# Patient Record
Sex: Male | Born: 1985
Health system: Southern US, Community
[De-identification: ages and names within clinical notes are randomized; demographics above are authoritative.]

## PROBLEM LIST (undated history)

## (undated) DIAGNOSIS — E119 Type 2 diabetes mellitus without complications: Secondary | ICD-10-CM

## (undated) DIAGNOSIS — IMO0001 Reserved for inherently not codable concepts without codable children: Secondary | ICD-10-CM

## (undated) DIAGNOSIS — F32A Depression, unspecified: Secondary | ICD-10-CM

## (undated) DIAGNOSIS — F329 Major depressive disorder, single episode, unspecified: Secondary | ICD-10-CM

## (undated) HISTORY — DX: Type 2 diabetes mellitus without complications: E11.9

---

## 2011-12-29 ENCOUNTER — Encounter (HOSPITAL_COMMUNITY): Payer: Self-pay | Admitting: Emergency Medicine

## 2011-12-29 ENCOUNTER — Emergency Department (HOSPITAL_COMMUNITY)
Admission: EM | Admit: 2011-12-29 | Discharge: 2011-12-29 | Disposition: A | Discharge status: Home / Self Care | Payer: Self-pay | Attending: Emergency Medicine | Admitting: Emergency Medicine

## 2011-12-29 DIAGNOSIS — M1990 Unspecified osteoarthritis, unspecified site: Secondary | ICD-10-CM

## 2011-12-29 DIAGNOSIS — M129 Arthropathy, unspecified: Secondary | ICD-10-CM | POA: Insufficient documentation

## 2011-12-29 DIAGNOSIS — F17200 Nicotine dependence, unspecified, uncomplicated: Secondary | ICD-10-CM | POA: Insufficient documentation

## 2011-12-29 NOTE — Discharge Instructions (Signed)
Use heat on the sore areas 3 times a day for several days.  Take ibuprofen 400 mg 3 times a day for one week.  See the Dr. of your choice for problems.  Arthritis, Nonspecific Arthritis is inflammation of a joint. This usually means pain, redness, warmth or swelling are present. One or more joints may be involved. There are a number of types of arthritis. Your caregiver may not be able to tell what type of arthritis you have right away. CAUSES  The most common cause of arthritis is the wear and tear on the joint (osteoarthritis). This causes damage to the cartilage, which can break down over time. The knees, hips, back and neck are most often affected by this type of arthritis. Other types of arthritis and common causes of joint pain include:  Sprains and other injuries near the joint. Sometimes minor sprains and injuries cause pain and swelling that develop hours later.   Rheumatoid arthritis. This affects hands, feet and knees. It usually affects both sides of your body at the same time. It is often associated with chronic ailments, fever, weight loss and general weakness.   Crystal arthritis. Gout and pseudo gout can cause occasional acute severe pain, redness and swelling in the foot, ankle, or knee.   Infectious arthritis. Bacteria can get into a joint through a break in overlying skin. This can cause infection of the joint. Bacteria and viruses can also spread through the blood and affect your joints.   Drug, infectious and allergy reactions. Sometimes joints can become mildly painful and slightly swollen with these types of illnesses.  SYMPTOMS   Pain is the main symptom.   Your joint or joints can also be red, swollen and warm or hot to the touch.   You may have a fever with certain types of arthritis, or even feel overall ill.   The joint with arthritis will hurt with movement. Stiffness is present with some types of arthritis.  DIAGNOSIS  Your caregiver will suspect  arthritis based on your description of your symptoms and on your exam. Testing may be needed to find the type of arthritis:  Blood and sometimes urine tests.   X-ray tests and sometimes CT or MRI scans.   Removal of fluid from the joint (arthrocentesis) is done to check for bacteria, crystals or other causes. Your caregiver (or a specialist) will numb the area over the joint with a local anesthetic, and use a needle to remove joint fluid for examination. This procedure is only minimally uncomfortable.   Even with these tests, your caregiver may not be able to tell what kind of arthritis you have. Consultation with a specialist (rheumatologist) may be helpful.  TREATMENT  Your caregiver will discuss with you treatment specific to your type of arthritis. If the specific type cannot be determined, then the following general recommendations may apply. Treatment of severe joint pain includes:  Rest.   Elevation.   Anti-inflammatory medication (for example, ibuprofen) may be prescribed. Avoiding activities that cause increased pain.   Only take over-the-counter or prescription medicines for pain and discomfort as recommended by your caregiver.   Cold packs over an inflamed joint may be used for 10 to 15 minutes every hour. Hot packs sometimes feel better, but do not use overnight. Do not use hot packs if you are diabetic without your caregiver's permission.   A cortisone shot into arthritic joints may help reduce pain and swelling.   Any acute arthritis that gets worse over  the next 1 to 2 days needs to be looked at to be sure there is no joint infection.  Long-term arthritis treatment involves modifying activities and lifestyle to reduce joint stress jarring. This can include weight loss. Also, exercise is needed to nourish the joint cartilage and remove waste. This helps keep the muscles around the joint strong. HOME CARE INSTRUCTIONS   Do not take aspirin to relieve pain if gout is  suspected. This elevates uric acid levels.   Only take over-the-counter or prescription medicines for pain, discomfort or fever as directed by your caregiver.   Rest the joint as much as possible.   If your joint is swollen, keep it elevated.   Use crutches if the painful joint is in your leg.   Drinking plenty of fluids may help for certain types of arthritis.   Follow your caregiver's dietary instructions.   Try low-impact exercise such as:   Swimming.   Water aerobics.   Biking.   Walking.   Morning stiffness is often relieved by a warm shower.   Put your joints through regular range-of-motion.  SEEK MEDICAL CARE IF:   You do not feel better in 24 hours or are getting worse.   You have side effects to medications, or are not getting better with treatment.  SEEK IMMEDIATE MEDICAL CARE IF:   You have a fever.   You develop severe joint pain, swelling or redness.   Many joints are involved and become painful and swollen.   There is severe back pain and/or leg weakness.   You have loss of bowel or bladder control.  Document Released: 09/28/2004 Document Revised: 08/10/2011 Document Reviewed: 10/14/2008 [facility] Patient Information 2012 [place], [place].  RESOURCE GUIDE  Dental Problems  Patients with Medicaid: [facility] [phone] [address]                                           [phone] [address] Phone:  [phone]                                                  Phone:  [phone]  If unable to pay or uninsured, contact:  [facility] or [facility]. to become qualified for the adult dental clinic.  Chronic Pain Problems Contact [facility]  [phone] Patients need to be referred by their primary care doctor.  Insufficient Money for Medicine Contact [employer]:  call "211" or [facility] 229-106-7808.  No Primary Care Doctor Call [facility]   [phone] Other agencies that provide inexpensive medical care    [facility]  [phone]    [facility]  [phone]    [facility]  (807)428-7745    [facility]  [phone]    [facility]  281-376-6756    [facility]  8038593721  Psychological Services [facility]  [phone] [facility]  ([phone] [facility]   [phone] (emergency services 440-723-4512)  Substance Abuse Resources [facility]  904-709-4817 [facility] 203-684-9567 [facility] 717-299-9824 [facility] (804)532-0462 Residential & Outpatient Substance Abuse Program  905-299-9011  Abuse/Neglect [facility] Child Abuse Hotline (848)796-1469 [facility] Child Abuse Hotline 239-533-9007 (After Hours)  Emergency [facility]ter [facility] (980) 616-0175  [facility] [place] [facility] [place] (317) 099-2821 [facility] 346-332-9079  MRSA Hotline #:   3235006598    [facility] Resources  [facility] [place]     [employer]                          [facility] Dept. [address] [place]                       [address]      [address]  Blondell Reveal Phone:  [phone]                                   Phone:  [phone]                 Phone:  [phone]  [facility] Phone:  [phone]  [facility] Child Abuse Hotline 907-165-2128 [phone] (After Hours)

## 2011-12-29 NOTE — ED Notes (Signed)
Discharge instructions reviewed with pt; verbalizes understanding.  No questions asked; no further c/o's voiced.  Pt ambulatory to lobby.  NAD noted. 

## 2011-12-29 NOTE — ED Notes (Signed)
Patient states awake at 0300 went to sleep woke up at 1000 and had right upper extremity swelling radiating to right hand. Pain 1/10 achy dull needle feeling.  Radial pulses +2 bilateral equal. Bilateral equal sensation upper extremities,

## 2011-12-29 NOTE — ED Provider Notes (Signed)
History     CSN: [id number]  Arrival date & time 12/29/11  1354   None     Chief Complaint  Patient presents with  . Extremity Weakness    (Consider location/radiation/quality/duration/timing/severity/associated sxs/prior treatment) HPI Comments: Tyler Maxwell is a 26 y.o. Male who reports right hand pain and swelling since awakening today. His also does some cramps in his hands when he is at work for the last couple weeks; they're intermittent. No fever, chills, nausea, vomiting, neck pain or back pain. No trauma to the hands, arms, or neck. He has not tried nothing for the discomfort.  Patient is a 26 y.o. male presenting with extremity weakness. The history is provided by the patient.  Extremity Weakness    History reviewed. No pertinent past medical history.  History reviewed. No pertinent past surgical history.  No family history on file.  History  Substance Use Topics  . Smoking status: Current Some Day Smoker  . Smokeless tobacco: Not on file  . Alcohol Use: Yes      Review of Systems  Musculoskeletal: Positive for extremity weakness.  All other systems reviewed and are negative.    Allergies  Review of patient's allergies indicates no known allergies.  Home Medications  No current outpatient prescriptions on file.  BP 124/85  Temp(Src) 98.2 F (36.8 C) (Oral)  Resp 16  SpO2 98%  Physical Exam  Nursing note and vitals reviewed. Constitutional: He is oriented to person, place, and time. He appears well-developed and well-nourished.  HENT:  Head: Normocephalic and atraumatic.  Right Ear: External ear normal.  Left Ear: External ear normal.  Eyes: Conjunctivae and EOM are normal.  Neck: Normal range of motion and phonation normal. Neck supple.  Cardiovascular: Normal rate.   Pulmonary/Chest: Effort normal. He exhibits no bony tenderness.  Abdominal: Soft. Normal appearance.  Musculoskeletal: Normal range of motion.       Right hand has thick  calluses on the fingertips. No significant joint swelling. He has palpable tenderness of the dorsum of the wrist bones and his PIP joints 2 through 5. There are no deformities. There is no frank tendinitis. No erythema, no fluctuant areas.  Neurological: He is alert and oriented to person, place, and time. He has normal strength. No cranial nerve deficit or sensory deficit. He exhibits normal muscle tone. Coordination normal.  Skin: Skin is warm, dry and intact.  Psychiatric: He has a normal mood and affect. His behavior is normal. Judgment and thought content normal.    ED Course  Procedures (including critical care time)  Labs Reviewed - No data to display No results found.   1. Arthritis       MDM  Nonspecific wrist and hand pain, likely related to mild arthritis, which is likely use related from his job his age and prior. The tendinitis, septic arthritis, stress or traumatic fracture.  Plan: Home Medications- OTC Advil; Home Treatments- Heat; Recommended follow up-  PCP prn        Flint Melter, MD 12/30/11 1004

## 2012-01-03 ENCOUNTER — Emergency Department (HOSPITAL_COMMUNITY)
Admission: EM | Admit: 2012-01-03 | Discharge: 2012-01-03 | Discharge status: Absent without leave | Payer: Self-pay | Source: Home / Self Care

## 2012-01-03 ENCOUNTER — Encounter (HOSPITAL_COMMUNITY): Payer: Self-pay | Admitting: Emergency Medicine

## 2012-01-03 ENCOUNTER — Emergency Department (HOSPITAL_COMMUNITY)
Admission: EM | Admit: 2012-01-03 | Discharge: 2012-01-03 | Disposition: A | Discharge status: Home / Self Care | Payer: Self-pay | Attending: Emergency Medicine | Admitting: Emergency Medicine

## 2012-01-03 DIAGNOSIS — M25519 Pain in unspecified shoulder: Secondary | ICD-10-CM | POA: Insufficient documentation

## 2012-01-03 DIAGNOSIS — IMO0001 Reserved for inherently not codable concepts without codable children: Secondary | ICD-10-CM | POA: Insufficient documentation

## 2012-01-03 DIAGNOSIS — M79609 Pain in unspecified limb: Secondary | ICD-10-CM | POA: Insufficient documentation

## 2012-01-03 DIAGNOSIS — M7989 Other specified soft tissue disorders: Secondary | ICD-10-CM | POA: Insufficient documentation

## 2012-01-03 DIAGNOSIS — G540 Brachial plexus disorders: Secondary | ICD-10-CM | POA: Insufficient documentation

## 2012-01-03 HISTORY — DX: Reserved for inherently not codable concepts without codable children: IMO0001

## 2012-01-03 MED ORDER — PREDNISONE (PAK) 10 MG PO TABS: 10.0000 mg | ORAL_TABLET | Freq: Every day | ORAL | Status: AC

## 2012-01-03 MED ORDER — TRAMADOL HCL 50 MG PO TABS: 50.0000 mg | ORAL_TABLET | Freq: Four times a day (QID) | ORAL | Status: AC | PRN

## 2012-01-03 NOTE — Discharge Instructions (Signed)
Thoracic Outlet Syndrome  with Rehab Thoracic outlet syndrome is a condition in which nerves, and vessels (less common), are compressed or squeezed in the chest (thoracic) cavity. This results in pain and weakness in the shoulders, arms, and hands.  SYMPTOMS   Signs of nerve damage: pain, numbness, and tingling in the arm or hand.   Arm and hand weakness.   Signs of vascular damage: coldness, inflammation, blue or pale color in the hand and fingers (uncommon).  CAUSES  Thoracic outlet syndrome is caused by the squeezing of nerves, and possibly vessels, in the chest cavity, before they extend into the arm and hand. Just before leaving the chest cavity, the bundle of nerves and vessels (brachial plexus) passes by the collarbone (clavicle) and ribs. In this area, the bundle of nerves can come under increased pressure, which may result in thoracic outlet syndrome. Common sources of pressure include:  Rib cage.   Fracture of the first rib or clavicle.   Neck muscles that are too large.   Extending the arm above the head for a long period of time (during sleep or while unconscious)   Tumor (rare).  RISK INCREASES WITH:  Break (fracture) of the clavicle or first rib.   Neck muscles that become too large from bodybuilding.   Fast weight loss combined with vigorous physical exercise.  PREVENTION  Avoid activities where shoulder or neck injury are likely.   Wear properly fitted and padded protective equipment.   Maintain good posture.   Avoid carrying a bag or backpack that places pressure on the affected side.   Change sleeping positions. Try sleeping on one side, or sleep without a firm pillow.   Avoid building large neck muscles.  PROGNOSIS  If treated properly, symptoms of thoracic outlet syndrome normally go away with non-surgical treatment. Sometimes, surgery is necessary to free the bundle of nerves from pressure.  RELATED COMPLICATIONS   Permanent nerve damage, including  pain, numbness, tingling, or weakness.   Recurring symptoms that result in an ongoing problem.   Clotting (thrombosis) of the axillary vein in the shoulder. This is an emergency that needs to be treated immediately.   Risks of surgery: infection, bleeding, nerve damage, or damage to surrounding tissues.  TREATMENT  Treatment first involves resting from activities that aggravate the symptoms, and the use of ice and medicine to reduce pain and inflammation. The use of strengthening and stretching exercises may help reduce pain with activity. These exercises may be performed at home or with a therapist. It is important to improve posture and adjust sleeping habits to reduce the symptoms. If symptoms continue for more than 6 months, despite non-surgical treatment, surgery may be recommended. MEDICATION   If pain medicine is necessary, nonsteroidal anti-inflammatory medicines (aspirin and ibuprofen), or other minor pain relievers (acetaminophen), are often recommended.   Do not take pain medicine for 7 days before surgery.   Prescription pain relievers may be given if your caregiver thinks they are necessary. Use only as directed and only as much as you need.  SEEK MEDICAL CARE IF:   Treatment does not help, or the condition gets worse.   Any medicines produce negative side effects.   Any complications from surgery occur:   Pain, numbness, or coldness in the affected arm and hand.   Discoloration beneath the fingernails (blue or gray) of the affected hand.   Signs of infections: fever, pain, inflammation, redness, or persistent bleeding.  EXERCISES RANGE OF MOTION (ROM) AND STRETCHING EXERCISES -  Thoracic Outlet Syndrome These exercises may help you when beginning to recover from your injury. In order to successfully stop your symptoms, you must improve your posture. These exercises are designed to help reduce the forward-head and rounded-shoulder posture that contributes to this condition.  Your symptoms may go away with or without further involvement from your physician, physical therapist or athletic trainer. While completing these exercises, remember:   Restoring tissue flexibility helps return normal motion to the joints. This allows healthier, less painful movement and activity.   An effective stretch should be held for at least 30 seconds. Neck muscles are easily irritated, even if they do not seem to be bothered at the time of an exercise. It is often best to begin your stretches with hold times as short as 5 seconds and build up gradually.   A stretch should never be painful. You should only feel a gentle lengthening or release in the stretched tissue.  STRETECH - Axial Extension  Stand or sit on a firm surface. Assume a good posture: chest up, shoulders drawn back, abdominal (stomach) muscles slightly tense, knees unlocked (if standing), and feet hip width apart.   Slowly pull your chin back, so your head slides back and your chin slightly lowers. Continue to look straight ahead.   You should feel a gentle stretch in the back of your head. You should be certain not to feel a strong stretch, since this can cause headaches later.   Hold for __________ seconds.  Repeat __________ times. Complete this exercise __________ times per day. RANGE OF MOTION- Upper Thoracic Extension  Sit on a firm chair with a high back. Assume a good posture: chest up, shoulders drawn back, abdominal muscles slightly tense, and feet hip width apart. Place a small pillow or folded towel in the curve of your lower back, if you are having difficulty maintaining good posture.   Gently brace your neck with your hands, allowing your arms to rest on your chest.   Continue to support your neck and slowly extend your back over the chair. You will feel a stretch across your upper back.   Hold __________ seconds. Slowly return to the starting position.  Repeat __________ times. Complete this exercise  __________ times per day. STRETCH - Mid-thoracic Extension  Tape two tennis balls together, or secure them side-by-side in a sock.   Find a firm surface to lie on. Position the tennis balls so that one lies on each side of your spine, and they are both between your shoulder blades. You may bend your knees if you choose.   Remain in this position for 20 to 30 seconds. Gently move your body 1 to 2 inches up (in the direction of your head), so that the balls now roll on a slightly lower part of your back. Hold this position another 20-30 seconds. Continue to move the balls down your spine until you no longer feel a stretching sensation.  Repeat exercise __________ times, __________ times per day. STRENGTHENING EXERCISES - Thoracic Outlet Syndrome These exercises may help you when beginning to recover from your injury. In order to successfully stop your symptoms, you must improve your posture. These exercises are designed to help reduce the forward-head and rounded-shoulder posture that contributes to this condition. Your symptoms may go away with or without further involvement from your physician, physical therapist or athletic trainer. While completing these exercises, remember:   Muscles can gain both the endurance and the strength needed for everyday activities through  controlled exercises.   Complete these exercises as instructed by your physician, physical therapist or athletic trainer. Increase the resistance and repetitions only as guided.   You may experience muscle soreness or fatigue, but the pain or discomfort you are trying to eliminate should never worsen during these exercises. If this pain does get worse, stop and make sure you are following the directions exactly. If the pain is still present after adjustments, stop the exercise until you can discuss the trouble with your caretaker.  STRENGTH - Scapular Retractors  Secure a rubber exercise band or tubing around a fixed object (i.e.  table, pole), so that it is at the height of your shoulders when you are either standing, or sitting on a firm, armless chair.   With a palm-down grip, grasp an end of the band in each hand. Straighten your elbows out and lift your hands straight in front of you at shoulder height. Step back, away from the secured end of the band, until it becomes tense.   Squeezing your shoulder blades together, draw your elbows back as you bend them. Keep your upper arm lifted up, away from the sides of your body, throughout the exercise.   Hold __________ seconds. Slowly ease the tension on the band as you reverse the directions and return to the starting position.  Repeat __________ times. Complete this exercise __________ times per day. POSTURE - Thoracic Outlet Syndrome Keeping correct posture when sitting, standing or completing your activities will reduce the stress put on different body tissues. This will allow injured tissues a chance to heal and limit painful experiences. The following are general guidelines for improved posture. Your physician or physical therapist will provide you with any instructions specific to your needs. While reading these guidelines, remember:  The exercises prescribed by your caregiver will help you develop the flexibility and strength to maintain correct postures.   Correct posture provides the best environment for your joints to work. All your joints have less wear and tear when properly supported by a spine with good posture. This means you will experience a healthier, less painful body.   Correct posture must be practiced with all of your activities, especially prolonged sitting and standing. Correct posture is as important during repetitive, low-stress activities (i.e. typing) as it is when doing a single, heavy-load activity (i.e. lifting).  PROLONGED STANDING WHILE SLIGHTLY LEANING FORWARD When completing a task that requires you to lean forward, while standing in one  place for a long time, place one foot up on a stationary 2 - 4 inch high object, to help maintain the best posture. When both feet are on the ground, the low back tends to lose its slight inward curve. If this curve flattens (or becomes too large), then the back and your other joints will experience too much stress, tire more quickly, and can cause pain.  PROLONGED ACTIVITY IN A FLEXED POSITION When completing a task that requires you to bend forward at your waist or lean over a low surface, find a way to stabilize 3 out of 4 of your limbs. You can place a hand or elbow on your thigh or rest a knee on the surface you are reaching across. This will provide you more stability so that your muscles do not tire as quickly. By keeping your knees relaxed, or slightly bent, you will also reduce stress across your low back. WALKING Walk with an upright posture. Your ears, shoulders and hips should all line up. OFFICE WORK  When working at a desk, create an environment that supports good, upright posture. Without extra support, muscles tire, leading to too much strain on joints and other tissues.  CHAIR:   A chair should be able to slide under your desk when your back makes contact with the back of the chair. This allows you to work closely.   The chair's height should allow your eyes to be level with the upper part of your monitor and your hands to be slightly lower than your elbows.  BODY POSITION  Your feet should make contact with the floor. If this is not possible, use a footrest.   Keep your ears aligned over your shoulders. This will reduce stress on your neck and low back.  Document Released: 08/21/2005 Document Revised: 08/10/2011 Document Reviewed: 12/03/2008 [facility] Patient Information 2012 [place], [place].Thoracic Outlet Syndrome  with Rehab Thoracic outlet syndrome is a condition in which nerves, and vessels (less common), are compressed or squeezed in the chest (thoracic) cavity. This results  in pain and weakness in the shoulders, arms, and hands.  SYMPTOMS   Signs of nerve damage: pain, numbness, and tingling in the arm or hand.   Arm and hand weakness.   Signs of vascular damage: coldness, inflammation, blue or pale color in the hand and fingers (uncommon).  CAUSES  Thoracic outlet syndrome is caused by the squeezing of nerves, and possibly vessels, in the chest cavity, before they extend into the arm and hand. Just before leaving the chest cavity, the bundle of nerves and vessels (brachial plexus) passes by the collarbone (clavicle) and ribs. In this area, the bundle of nerves can come under increased pressure, which may result in thoracic outlet syndrome. Common sources of pressure include:  Rib cage.   Fracture of the first rib or clavicle.   Neck muscles that are too large.   Extending the arm above the head for a long period of time (during sleep or while unconscious)   Tumor (rare).  RISK INCREASES WITH:  Break (fracture) of the clavicle or first rib.   Neck muscles that become too large from bodybuilding.   Fast weight loss combined with vigorous physical exercise.  PREVENTION  Avoid activities where shoulder or neck injury are likely.   Wear properly fitted and padded protective equipment.   Maintain good posture.   Avoid carrying a bag or backpack that places pressure on the affected side.   Change sleeping positions. Try sleeping on one side, or sleep without a firm pillow.   Avoid building large neck muscles.  PROGNOSIS  If treated properly, symptoms of thoracic outlet syndrome normally go away with non-surgical treatment. Sometimes, surgery is necessary to free the bundle of nerves from pressure.  RELATED COMPLICATIONS   Permanent nerve damage, including pain, numbness, tingling, or weakness.   Recurring symptoms that result in an ongoing problem.   Clotting (thrombosis) of the axillary vein in the shoulder. This is an emergency that needs to  be treated immediately.   Risks of surgery: infection, bleeding, nerve damage, or damage to surrounding tissues.  TREATMENT  Treatment first involves resting from activities that aggravate the symptoms, and the use of ice and medicine to reduce pain and inflammation. The use of strengthening and stretching exercises may help reduce pain with activity. These exercises may be performed at home or with a therapist. It is important to improve posture and adjust sleeping habits to reduce the symptoms. If symptoms continue for more than 6 months, despite non-surgical treatment,  surgery may be recommended. MEDICATION   If pain medicine is necessary, nonsteroidal anti-inflammatory medicines (aspirin and ibuprofen), or other minor pain relievers (acetaminophen), are often recommended.   Do not take pain medicine for 7 days before surgery.   Prescription pain relievers may be given if your caregiver thinks they are necessary. Use only as directed and only as much as you need.  SEEK MEDICAL CARE IF:   Treatment does not help, or the condition gets worse.   Any medicines produce negative side effects.   Any complications from surgery occur:   Pain, numbness, or coldness in the affected arm and hand.   Discoloration beneath the fingernails (blue or gray) of the affected hand.   Signs of infections: fever, pain, inflammation, redness, or persistent bleeding.  EXERCISES RANGE OF MOTION (ROM) AND STRETCHING EXERCISES - Thoracic Outlet Syndrome These exercises may help you when beginning to recover from your injury. In order to successfully stop your symptoms, you must improve your posture. These exercises are designed to help reduce the forward-head and rounded-shoulder posture that contributes to this condition. Your symptoms may go away with or without further involvement from your physician, physical therapist or athletic trainer. While completing these exercises, remember:   Restoring tissue  flexibility helps return normal motion to the joints. This allows healthier, less painful movement and activity.   An effective stretch should be held for at least 30 seconds. Neck muscles are easily irritated, even if they do not seem to be bothered at the time of an exercise. It is often best to begin your stretches with hold times as short as 5 seconds and build up gradually.   A stretch should never be painful. You should only feel a gentle lengthening or release in the stretched tissue.  STRETECH - Axial Extension  Stand or sit on a firm surface. Assume a good posture: chest up, shoulders drawn back, abdominal (stomach) muscles slightly tense, knees unlocked (if standing), and feet hip width apart.   Slowly pull your chin back, so your head slides back and your chin slightly lowers. Continue to look straight ahead.   You should feel a gentle stretch in the back of your head. You should be certain not to feel a strong stretch, since this can cause headaches later.   Hold for __________ seconds.  Repeat __________ times. Complete this exercise __________ times per day. RANGE OF MOTION- Upper Thoracic Extension  Sit on a firm chair with a high back. Assume a good posture: chest up, shoulders drawn back, abdominal muscles slightly tense, and feet hip width apart. Place a small pillow or folded towel in the curve of your lower back, if you are having difficulty maintaining good posture.   Gently brace your neck with your hands, allowing your arms to rest on your chest.   Continue to support your neck and slowly extend your back over the chair. You will feel a stretch across your upper back.   Hold __________ seconds. Slowly return to the starting position.  Repeat __________ times. Complete this exercise __________ times per day. STRETCH - Mid-thoracic Extension  Tape two tennis balls together, or secure them side-by-side in a sock.   Find a firm surface to lie on. Position the tennis  balls so that one lies on each side of your spine, and they are both between your shoulder blades. You may bend your knees if you choose.   Remain in this position for 20 to 30 seconds. Gently move your  body 1 to 2 inches up (in the direction of your head), so that the balls now roll on a slightly lower part of your back. Hold this position another 20-30 seconds. Continue to move the balls down your spine until you no longer feel a stretching sensation.  Repeat exercise __________ times, __________ times per day. STRENGTHENING EXERCISES - Thoracic Outlet Syndrome These exercises may help you when beginning to recover from your injury. In order to successfully stop your symptoms, you must improve your posture. These exercises are designed to help reduce the forward-head and rounded-shoulder posture that contributes to this condition. Your symptoms may go away with or without further involvement from your physician, physical therapist or athletic trainer. While completing these exercises, remember:   Muscles can gain both the endurance and the strength needed for everyday activities through controlled exercises.   Complete these exercises as instructed by your physician, physical therapist or athletic trainer. Increase the resistance and repetitions only as guided.   You may experience muscle soreness or fatigue, but the pain or discomfort you are trying to eliminate should never worsen during these exercises. If this pain does get worse, stop and make sure you are following the directions exactly. If the pain is still present after adjustments, stop the exercise until you can discuss the trouble with your caretaker.  STRENGTH - Scapular Retractors  Secure a rubber exercise band or tubing around a fixed object (i.e. table, pole), so that it is at the height of your shoulders when you are either standing, or sitting on a firm, armless chair.   With a palm-down grip, grasp an end of the band in each  hand. Straighten your elbows out and lift your hands straight in front of you at shoulder height. Step back, away from the secured end of the band, until it becomes tense.   Squeezing your shoulder blades together, draw your elbows back as you bend them. Keep your upper arm lifted up, away from the sides of your body, throughout the exercise.   Hold __________ seconds. Slowly ease the tension on the band as you reverse the directions and return to the starting position.  Repeat __________ times. Complete this exercise __________ times per day. POSTURE - Thoracic Outlet Syndrome Keeping correct posture when sitting, standing or completing your activities will reduce the stress put on different body tissues. This will allow injured tissues a chance to heal and limit painful experiences. The following are general guidelines for improved posture. Your physician or physical therapist will provide you with any instructions specific to your needs. While reading these guidelines, remember:  The exercises prescribed by your caregiver will help you develop the flexibility and strength to maintain correct postures.   Correct posture provides the best environment for your joints to work. All your joints have less wear and tear when properly supported by a spine with good posture. This means you will experience a healthier, less painful body.   Correct posture must be practiced with all of your activities, especially prolonged sitting and standing. Correct posture is as important during repetitive, low-stress activities (i.e. typing) as it is when doing a single, heavy-load activity (i.e. lifting).  PROLONGED STANDING WHILE SLIGHTLY LEANING FORWARD When completing a task that requires you to lean forward, while standing in one place for a long time, place one foot up on a stationary 2 - 4 inch high object, to help maintain the best posture. When both feet are on the ground, the low back  tends to lose its slight  inward curve. If this curve flattens (or becomes too large), then the back and your other joints will experience too much stress, tire more quickly, and can cause pain.  PROLONGED ACTIVITY IN A FLEXED POSITION When completing a task that requires you to bend forward at your waist or lean over a low surface, find a way to stabilize 3 out of 4 of your limbs. You can place a hand or elbow on your thigh or rest a knee on the surface you are reaching across. This will provide you more stability so that your muscles do not tire as quickly. By keeping your knees relaxed, or slightly bent, you will also reduce stress across your low back. WALKING Walk with an upright posture. Your ears, shoulders and hips should all line up. OFFICE WORK When working at a desk, create an environment that supports good, upright posture. Without extra support, muscles tire, leading to too much strain on joints and other tissues.  CHAIR:   A chair should be able to slide under your desk when your back makes contact with the back of the chair. This allows you to work closely.   The chair's height should allow your eyes to be level with the upper part of your monitor and your hands to be slightly lower than your elbows.  BODY POSITION  Your feet should make contact with the floor. If this is not possible, use a footrest.   Keep your ears aligned over your shoulders. This will reduce stress on your neck and low back.  Document Released: 08/21/2005 Document Revised: 08/10/2011 Document Reviewed: 12/03/2008 [facility] Patient Information 2012 [place], [place].

## 2012-01-03 NOTE — ED Notes (Signed)
Patient complaining of right arm pain and swelling that started last week; patient was seen in the ED last week and diagnosed with tendonitis versus arthritis.  Patient states that he was given four days off of work and returned today, but his arm was too painful to work.  Patient able to wiggle all digits freely.  Patient has been taken Motrin at home for the pain.

## 2012-01-03 NOTE — ED Provider Notes (Signed)
Medical screening examination/treatment/procedure(s) were performed by non-physician practitioner and as supervising physician I was immediately available for consultation/collaboration.  Donnetta Hutching, MD 01/03/12 2348

## 2012-01-03 NOTE — ED Provider Notes (Signed)
History     CSN: [id number]  Arrival date & time 01/03/12  1939   First MD Initiated Contact with Patient 01/03/12 2131      Chief Complaint  Patient presents with  . Arm Pain    (Consider location/radiation/quality/duration/timing/severity/associated sxs/prior treatment) HPI  26 year old male presents c/o R arm pain.  Pt sts for the past 10 days he has been having gradual onset of R arm pain.  Pain initially started in R chest, and gradually radiates down to shoulder, upper and lower arm and eventually to hand.  Felt that his hands are weaker than usual.  Pain is throbbing, constant, worsening with arm rotation or with movement.  Does notice increase swelling to R arm as compare to left.  Also experiences tingling sensation down to arm. Denies fever, cp, sob, rash. Is R hand dominant and works at [employer] where he does admits to repetitive work. He was seen in ER 4 days ago for same. Was diagnosed with arthritis and was given Motrin and 4 days off work.  Return to work but unable to fully use R arm due to pain and weakness.  Sts Motrin helps minimally.  Denies recent trauma.   Past Medical History  Diagnosis Date  . No significant past medical history     History reviewed. No pertinent past surgical history.  History reviewed. No pertinent family history.  History  Substance Use Topics  . Smoking status: Current Some Day Smoker  . Smokeless tobacco: Not on file  . Alcohol Use: Yes      Review of Systems  Constitutional: Negative for fever.  Respiratory: Negative for chest tightness, shortness of breath and wheezing.   Cardiovascular: Negative for leg swelling.  Skin: Negative for rash.  Neurological: Positive for weakness.  All other systems reviewed and are negative.    Allergies  Review of patient's allergies indicates no known allergies.  Home Medications   Current Outpatient Rx  Name Route Sig Dispense Refill  . IBUPROFEN 200 MG PO TABS Oral Take 400 mg  by mouth every 6 (six) hours as needed. For pain      BP 134/80  Pulse 83  Temp(Src) 97.9 F (36.6 C) (Oral)  Resp 20  SpO2 98%  Physical Exam  Nursing note and vitals reviewed. Constitutional: He is oriented to person, place, and time. He appears well-developed and well-nourished. No distress.  HENT:  Head: Atraumatic.  Eyes: Conjunctivae are normal.  Neck: Neck supple.  Cardiovascular: Normal rate and regular rhythm.   Pulmonary/Chest: Effort normal. No respiratory distress. He has no wheezes. He exhibits no tenderness.  Neurological: He is alert and oriented to person, place, and time. He displays normal reflexes. No cranial nerve deficit. He exhibits normal muscle tone. Coordination normal.       R arm: tenderness with movement throughout.  Decreased grip strength.  2+ radial pulse, brisk cap refills, no overlying skin changes, sensation intact.  Negative tinel sign.  FROM, mild weakness noted as compare to L arm    Skin: Skin is warm.  Psychiatric: He has a normal mood and affect.    ED Course  Procedures (including critical care time)  Labs Reviewed - No data to display No results found.   No diagnosis found.    MDM  Persistent pain and weakness to R arm, originate from R chest. Unrelieved with rest and motrin.  Consider thoracic outlet syndrome due to tingling sensation.  Increase weakness to 4th,5th fingers.  Low  suspicion for carpal tunnel syndrome.  No suspicion for tenosynovitis.  Pt is afebrile.  Plan to give prednisone, hydrocodone, and f/u with ortho for further eval.  Pt voice understanding.  Doubt cardiopulmonary etiology.          Fayrene Helper, PA-C 01/03/12 2148

## 2012-08-27 ENCOUNTER — Emergency Department (HOSPITAL_COMMUNITY)
Admission: EM | Admit: 2012-08-27 | Discharge: 2012-08-27 | Discharge status: Against Medical Advice | Payer: Self-pay | Attending: Emergency Medicine | Admitting: Emergency Medicine

## 2012-08-27 ENCOUNTER — Encounter (HOSPITAL_COMMUNITY): Payer: Self-pay | Admitting: *Deleted

## 2012-08-27 DIAGNOSIS — F17200 Nicotine dependence, unspecified, uncomplicated: Secondary | ICD-10-CM | POA: Insufficient documentation

## 2012-08-27 DIAGNOSIS — F39 Unspecified mood [affective] disorder: Secondary | ICD-10-CM | POA: Insufficient documentation

## 2012-08-27 DIAGNOSIS — F1010 Alcohol abuse, uncomplicated: Secondary | ICD-10-CM | POA: Insufficient documentation

## 2012-08-27 DIAGNOSIS — F10929 Alcohol use, unspecified with intoxication, unspecified: Secondary | ICD-10-CM

## 2012-08-27 LAB — CBC WITH DIFFERENTIAL/PLATELET
Eosinophils Relative: 5 % (ref 0–5)
HCT: 45.2 % (ref 39.0–52.0)
Hemoglobin: 15.8 g/dL (ref 13.0–17.0)
Lymphocytes Relative: 46 % (ref 12–46)
Lymphs Abs: 2.5 10*3/uL (ref 0.7–4.0)
MCV: 90.2 fL (ref 78.0–100.0)
Monocytes Absolute: 0.4 10*3/uL (ref 0.1–1.0)
Monocytes Relative: 8 % (ref 3–12)
Neutro Abs: 2.1 10*3/uL (ref 1.7–7.7)
RBC: 5.01 MIL/uL (ref 4.22–5.81)
WBC: 5.4 10*3/uL (ref 4.0–10.5)

## 2012-08-27 LAB — RAPID URINE DRUG SCREEN, HOSP PERFORMED
Barbiturates: NOT DETECTED
Benzodiazepines: NOT DETECTED

## 2012-08-27 LAB — URINALYSIS, ROUTINE W REFLEX MICROSCOPIC
Ketones, ur: NEGATIVE mg/dL
Leukocytes, UA: NEGATIVE
Nitrite: NEGATIVE
Protein, ur: NEGATIVE mg/dL
Urobilinogen, UA: 0.2 mg/dL (ref 0.0–1.0)
pH: 6.5 (ref 5.0–8.0)

## 2012-08-27 LAB — COMPREHENSIVE METABOLIC PANEL
AST: 25 U/L (ref 0–37)
CO2: 25 mEq/L (ref 19–32)
Calcium: 9.9 mg/dL (ref 8.4–10.5)
Chloride: 103 mEq/L (ref 96–112)
Creatinine, Ser: 0.83 mg/dL (ref 0.50–1.35)
GFR calc Af Amer: >90 mL/min (ref >90)
GFR calc non Af Amer: >90 mL/min (ref >90)
Glucose, Bld: 86 mg/dL (ref 70–99)
Total Bilirubin: 0.3 mg/dL (ref 0.3–1.2)

## 2012-08-27 NOTE — ED Provider Notes (Addendum)
History     CSN: [id number]  Arrival date & time 08/27/12  0058   First MD Initiated Contact with Patient 08/27/12 0214      Chief Complaint  Patient presents with  . detox from alcohol     (Consider location/radiation/quality/duration/timing/severity/associated sxs/prior treatment) HPI Patient states he wants detox from alcohol. Last drink 40 minutes prior to coming here. Denies using other drugs. No other complaint no treatment prior to coming here  Past Medical History  Diagnosis Date  . No significant past medical history    past medicalhistory Alcohol abuse  No past surgical history on file. Surgical history hernia repair  No family history on file.  History  Substance Use Topics  . Smoking status: Current Some Day Smoker  . Smokeless tobacco: Not on file  . Alcohol Use: Yes     no drug use  Review of Systems  Constitutional: Negative.   HENT: Negative.   Respiratory: Negative.   Cardiovascular: Negative.   Gastrointestinal: Negative.   Musculoskeletal: Negative.   Skin: Negative.   Neurological: Negative.   Hematological: Negative.   Psychiatric/Behavioral: Positive for dysphoric mood.       Depressed, not suicidal or homicidal  All other systems reviewed and are negative.    Allergies  Review of patient's allergies indicates no known allergies.  Home Medications   Current Outpatient Rx  Name  Route  Sig  Dispense  Refill  . IBUPROFEN 200 MG PO TABS   Oral   Take 400 mg by mouth every 6 (six) hours as needed. For pain           BP 127/74  Pulse 83  Temp 98.1 F (36.7 C) (Oral)  Resp 16  SpO2 97%  Physical Exam  Nursing note and vitals reviewed. Constitutional: He is oriented to person, place, and time. He appears well-developed and well-nourished.       Appears intoxicated speech slurred  HENT:  Head: Normocephalic and atraumatic.  Eyes: Conjunctivae normal are normal. Pupils are equal, round, and reactive to light.  Neck: Neck  supple. No tracheal deviation present. No thyromegaly present.  Cardiovascular: Normal rate and regular rhythm.   No murmur heard. Pulmonary/Chest: Effort normal and breath sounds normal.  Abdominal: Soft. Bowel sounds are normal. He exhibits no distension. There is no tenderness.  Musculoskeletal: Normal range of motion. He exhibits no edema and no tenderness.  Neurological: He is alert and oriented to person, place, and time. Coordination normal.       Gait normal  Skin: Skin is warm and dry. No rash noted.  Psychiatric: He has a normal mood and affect.    ED Course  Procedures (including critical care time)  Labs Reviewed  CBC WITH DIFFERENTIAL - Abnormal; Notable for the following:    Neutrophils Relative 40 (*)     All other components within normal limits  ETHANOL - Abnormal; Notable for the following:    Alcohol, Ethyl (B) 285 (*)     All other components within normal limits  URINALYSIS, ROUTINE W REFLEX MICROSCOPIC  COMPREHENSIVE METABOLIC PANEL  URINE RAPID DRUG SCREEN (HOSP PERFORMED)   No results found.   No diagnosis found.  2:20 AM patient requests to go home Is presently too intoxicated to leave unsupervised  2:40 AM I was notified the patient left the emergency department without telling staff. Nursing staff reported that patient was able to get a ride home. He left without waiting for instructions MDM   Results for orders  placed during the hospital encounter of 08/27/12  URINALYSIS, ROUTINE W REFLEX MICROSCOPIC      Component Value Range   Color, Urine YELLOW  YELLOW   APPearance CLEAR  CLEAR   Specific Gravity, Urine 1.005  1.005 - 1.030   pH 6.5  5.0 - 8.0   Glucose, UA NEGATIVE  NEGATIVE mg/dL   Hgb urine dipstick NEGATIVE  NEGATIVE   Bilirubin Urine NEGATIVE  NEGATIVE   Ketones, ur NEGATIVE  NEGATIVE mg/dL   Protein, ur NEGATIVE  NEGATIVE mg/dL   Urobilinogen, UA 0.2  0.0 - 1.0 mg/dL   Nitrite NEGATIVE  NEGATIVE   Leukocytes, UA NEGATIVE   NEGATIVE  URINE RAPID DRUG SCREEN (HOSP PERFORMED)      Component Value Range   Opiates NONE DETECTED  NONE DETECTED   Cocaine NONE DETECTED  NONE DETECTED   Benzodiazepines NONE DETECTED  NONE DETECTED   Amphetamines NONE DETECTED  NONE DETECTED   Tetrahydrocannabinol NONE DETECTED  NONE DETECTED   Barbiturates NONE DETECTED  NONE DETECTED  CBC WITH DIFFERENTIAL      Component Value Range   WBC 5.4  4.0 - 10.5 K/uL   RBC 5.01  4.22 - 5.81 MIL/uL   Hemoglobin 15.8  13.0 - 17.0 g/dL   HCT 16.1  09.6 - 04.5 %   MCV 90.2  78.0 - 100.0 fL   MCH 31.5  26.0 - 34.0 pg   MCHC 35.0  30.0 - 36.0 g/dL   RDW 40.9  81.1 - 91.4 %   Platelets 280  150 - 400 K/uL   Neutrophils Relative 40 (*) 43 - 77 %   Neutro Abs 2.1  1.7 - 7.7 K/uL   Lymphocytes Relative 46  12 - 46 %   Lymphs Abs 2.5  0.7 - 4.0 K/uL   Monocytes Relative 8  3 - 12 %   Monocytes Absolute 0.4  0.1 - 1.0 K/uL   Eosinophils Relative 5  0 - 5 %   Eosinophils Absolute 0.3  0.0 - 0.7 K/uL   Basophils Relative 1  0 - 1 %   Basophils Absolute 0.1  0.0 - 0.1 K/uL  COMPREHENSIVE METABOLIC PANEL      Component Value Range   Sodium 142  135 - 145 mEq/L   Potassium 3.6  3.5 - 5.1 mEq/L   Chloride 103  96 - 112 mEq/L   CO2 25  19 - 32 mEq/L   Glucose, Bld 86  70 - 99 mg/dL   BUN 6  6 - 23 mg/dL   Creatinine, Ser 7.82  0.50 - 1.35 mg/dL   Calcium 9.9  8.4 - 95.6 mg/dL   Total Protein 8.3  6.0 - 8.3 g/dL   Albumin 4.7  3.5 - 5.2 g/dL   AST 25  0 - 37 U/L   ALT 13  0 - 53 U/L   Alkaline Phosphatase 78  39 - 117 U/L   Total Bilirubin 0.3  0.3 - 1.2 mg/dL   GFR calc non Af Amer >90  >90 mL/min   GFR calc Af Amer >90  >90 mL/min  ETHANOL      Component Value Range   Alcohol, Ethyl (B) 285 (*) 0 - 11 mg/dL   No results found.   In light of patient's getting a ride home, we'll not send police to retrieve him. He was mildly intoxicated on my exam Diagnosis alcohol intoxication        Doug Sou, MD 08/27/12  4540  Doug Sou, MD 08/27/12 [phone]

## 2012-08-27 NOTE — ED Notes (Signed)
The pt wants to be detoxed from alcohol.  His last drink was earlier tonight

## 2012-08-27 NOTE — ED Notes (Signed)
Pt not in room upon rounding. Nurse first states patient left with a ride. Pt did not inform this RN or any other staff member he was leaving.

## 2012-08-27 NOTE — ED Notes (Signed)
Pt reports drinking 1-2 40oz beers for the past two years.

## 2012-10-29 ENCOUNTER — Emergency Department (HOSPITAL_COMMUNITY)
Admission: EM | Admit: 2012-10-29 | Discharge: 2012-10-29 | Disposition: A | Discharge status: Other Acute Inpatient Hospital | Payer: Federal, State, Local not specified - Other | Attending: Emergency Medicine | Admitting: Emergency Medicine

## 2012-10-29 ENCOUNTER — Inpatient Hospital Stay (HOSPITAL_COMMUNITY)
Admission: AD | Admit: 2012-10-29 | Discharge: 2012-10-31 | DRG: 897 | Disposition: A | Discharge status: Home / Self Care | Payer: Federal, State, Local not specified - Other | Source: Intra-hospital | Attending: Psychiatry | Admitting: Psychiatry

## 2012-10-29 ENCOUNTER — Encounter (HOSPITAL_COMMUNITY): Payer: Self-pay | Admitting: *Deleted

## 2012-10-29 DIAGNOSIS — F1010 Alcohol abuse, uncomplicated: Principal | ICD-10-CM

## 2012-10-29 DIAGNOSIS — R45851 Suicidal ideations: Secondary | ICD-10-CM | POA: Insufficient documentation

## 2012-10-29 DIAGNOSIS — F17200 Nicotine dependence, unspecified, uncomplicated: Secondary | ICD-10-CM | POA: Insufficient documentation

## 2012-10-29 LAB — COMPREHENSIVE METABOLIC PANEL
AST: 64 U/L — ABNORMAL HIGH (ref 0–37)
Albumin: 4.6 g/dL (ref 3.5–5.2)
BUN: 5 mg/dL — ABNORMAL LOW (ref 6–23)
Chloride: 100 mEq/L (ref 96–112)
Creatinine, Ser: 0.77 mg/dL (ref 0.50–1.35)
Potassium: 3.7 mEq/L (ref 3.5–5.1)
Total Protein: 8.6 g/dL — ABNORMAL HIGH (ref 6.0–8.3)

## 2012-10-29 LAB — RAPID URINE DRUG SCREEN, HOSP PERFORMED
Amphetamines: NOT DETECTED
Benzodiazepines: NOT DETECTED
Cocaine: NOT DETECTED
Opiates: NOT DETECTED

## 2012-10-29 LAB — CBC
HCT: 42.8 % (ref 39.0–52.0)
MCHC: 36 g/dL (ref 30.0–36.0)
MCV: 90.7 fL (ref 78.0–100.0)
Platelets: 245 10*3/uL (ref 150–400)
RDW: 12.9 % (ref 11.5–15.5)
WBC: 4.7 10*3/uL (ref 4.0–10.5)

## 2012-10-29 LAB — SALICYLATE LEVEL: Salicylate Lvl: <2 mg/dL — ABNORMAL LOW (ref 2.8–20.0)

## 2012-10-29 LAB — ETHANOL: Alcohol, Ethyl (B): 322 mg/dL — ABNORMAL HIGH (ref 0–11)

## 2012-10-29 LAB — ACETAMINOPHEN LEVEL: Acetaminophen (Tylenol), Serum: <15 ug/mL (ref 10–30)

## 2012-10-29 MED ORDER — THIAMINE HCL 100 MG/ML IJ SOLN: 100.0000 mg | Freq: Once | INTRAMUSCULAR | Status: DC

## 2012-10-29 MED ORDER — LOPERAMIDE HCL 2 MG PO CAPS: 2.0000 mg | ORAL_CAPSULE | ORAL | Status: DC | PRN

## 2012-10-29 MED ORDER — CHLORDIAZEPOXIDE HCL 25 MG PO CAPS: 25.0000 mg | ORAL_CAPSULE | Freq: Four times a day (QID) | ORAL | Status: DC | PRN

## 2012-10-29 MED ORDER — VITAMIN B-1 100 MG PO TABS
100.0000 mg | ORAL_TABLET | Freq: Every day | ORAL | Status: DC
  Administered 2012-10-30 – 2012-10-31 (×2): 100 mg via ORAL
  Filled 2012-10-29 (×4): qty 1

## 2012-10-29 MED ORDER — MAGNESIUM HYDROXIDE 400 MG/5ML PO SUSP: 30.0000 mL | Freq: Every day | ORAL | Status: DC | PRN

## 2012-10-29 MED ORDER — HYDROXYZINE HCL 25 MG PO TABS
25.0000 mg | ORAL_TABLET | Freq: Four times a day (QID) | ORAL | Status: DC | PRN
  Administered 2012-10-31: 25 mg via ORAL

## 2012-10-29 MED ORDER — ONDANSETRON 4 MG PO TBDP: 4.0000 mg | ORAL_TABLET | Freq: Four times a day (QID) | ORAL | Status: DC | PRN

## 2012-10-29 MED ORDER — ALUM & MAG HYDROXIDE-SIMETH 200-200-20 MG/5ML PO SUSP
30.0000 mL | ORAL | Status: DC | PRN
  Administered 2012-10-30: 30 mL via ORAL

## 2012-10-29 MED ORDER — CHLORDIAZEPOXIDE HCL 25 MG PO CAPS
50.0000 mg | ORAL_CAPSULE | Freq: Once | ORAL | Status: AC
  Administered 2012-10-29: 50 mg via ORAL
  Filled 2012-10-29: qty 2

## 2012-10-29 MED ORDER — ADULT MULTIVITAMIN W/MINERALS CH
1.0000 | ORAL_TABLET | Freq: Every day | ORAL | Status: DC
  Administered 2012-10-30 – 2012-10-31 (×2): 1 via ORAL
  Filled 2012-10-29 (×4): qty 1

## 2012-10-29 MED ORDER — ACETAMINOPHEN 325 MG PO TABS: 650.0000 mg | ORAL_TABLET | Freq: Four times a day (QID) | ORAL | Status: DC | PRN

## 2012-10-29 NOTE — ED Notes (Signed)
Pt up to nurses station to get something to drink.

## 2012-10-29 NOTE — ED Notes (Signed)
PT FATHER HAS TAKEN HIS BELONGINGS HOME.

## 2012-10-29 NOTE — ED Notes (Signed)
Patient is sleeping. He does stir about and open eyes with verbal and tactile stimulus. Patient is to be observed while sobering up then will further evaluate pt si and request for detox assistance. Sitter is at bedside for safety.

## 2012-10-29 NOTE — ED Provider Notes (Signed)
History     CSN: [id number]  Arrival date & time 10/29/12  0209   First MD Initiated Contact with Patient 10/29/12 0250      Chief Complaint  Patient presents with  . Alcohol Intoxication  . Suicidal     Patient is a 27 y.o. male presenting with intoxication. The history is provided by the patient.  Alcohol Intoxication This is a new problem. Episode onset: unknown time ago. The problem occurs constantly. The problem has been gradually worsening. Nothing aggravates the symptoms. Nothing relieves the symptoms.  pt presents for alcohol intoxication.  He told the nurse he wants to harm himself  Past Medical History  Diagnosis Date  . No significant past medical history     History reviewed. No pertinent past surgical history.  History reviewed. No pertinent family history.  History  Substance Use Topics  . Smoking status: Current Some Day Smoker  . Smokeless tobacco: Not on file  . Alcohol Use: Yes     Comment: 3-4 40's a day      Review of Systems  Unable to perform ROS: Mental status change    Allergies  Review of patient's allergies indicates no known allergies.  Home Medications  No current outpatient prescriptions on file.  BP 116/67  Pulse 72  Temp(Src) 97.7 F (36.5 C) (Oral)  Resp 16  SpO2 98%  Physical Exam CONSTITUTIONAL: somnolent but arousable HEAD: Normocephalic/atraumatic, no signs of trauma EYES: EOMI/PERRL ENMT: Mucous membranes moist NECK: supple no meningeal signs, no bruising noted to his neck SPINE:entire spine nontender CV: S1/S2 noted, no murmurs/rubs/gallops noted LUNGS: Lungs are clear to auscultation bilaterally, no apparent distress ABDOMEN: soft, nontender, no rebound or guarding GU:no cva tenderness NEURO: Pt is somnolent but arousable, moves all extremities x4 EXTREMITIES: pulses normal, full ROM, no signs of trauma SKIN: warm, color normal PSYCH: somnolent  ED Course  Procedures (including critical care time)  Labs  Reviewed  COMPREHENSIVE METABOLIC PANEL - Abnormal; Notable for the following:    Glucose, Bld 107 (*)    BUN 5 (*)    Total Protein 8.6 (*)    AST 64 (*)    All other components within normal limits  ETHANOL - Abnormal; Notable for the following:    Alcohol, Ethyl (B) 322 (*)    All other components within normal limits  SALICYLATE LEVEL - Abnormal; Notable for the following:    Salicylate Lvl <2.0 (*)    All other components within normal limits  ACETAMINOPHEN LEVEL  CBC  URINE RAPID DRUG SCREEN (HOSP PERFORMED)    Pt arrived with etoh intox and suicidal.  By the time of my eval he was sleeping but he is arousable.  He is heavily intoxicated.  I have spoken to ACT who will see patient once sober.   Labs reassuring thus far He reported he tried to hang himself last week but I don't see any signs of trauma to his neck   MDM  Nursing notes including past medical history and social history reviewed and considered in documentation Labs/vital reviewed and considered         Joya Gaskins, MD 10/29/12 [phone]

## 2012-10-29 NOTE — ED Notes (Signed)
Security here to escort Pt. To [facility]. Paperwork and belongings given to security. Security and sitter escorted Pt. Off unit .

## 2012-10-29 NOTE — ED Notes (Addendum)
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 Seeking help for detox from ETOH. Pt currently intoxicated, unsure of how much he has had to drink. AO x4. Clear speech. Calm and cooperative. Pt reports that he typically drinks 4-5 40's/ day along with an 8 pack of beer. Pt states "I have a problem, and I need to get help."

## 2012-10-29 NOTE — Tx Team (Signed)
Initial Interdisciplinary Treatment Plan  PATIENT STRENGTHS: (choose at least two) Ability for insight Active sense of humor Average or above average intelligence Capable of independent living Motivation for treatment/growth Physical Health Supportive family/friends  PATIENT STRESSORS: Substance abuse   PROBLEM LIST: Problem List/Patient Goals Date to be addressed Date deferred Reason deferred Estimated date of resolution  Alcohol Detox 10/29/12                                                      DISCHARGE CRITERIA:  Adequate post-discharge living arrangements Improved stabilization in mood, thinking, and/or behavior Medical problems require only outpatient monitoring Motivation to continue treatment in a less acute level of care Verbal commitment to aftercare and medication compliance Withdrawal symptoms are absent or subacute and managed without 24-hour nursing intervention  PRELIMINARY DISCHARGE PLAN: Attend aftercare/continuing care group Attend PHP/IOP Attend 12-step recovery group Outpatient therapy Return to previous living arrangement  PATIENT/FAMIILY INVOLVEMENT: This treatment plan has been presented to and reviewed with the patient, Tyler Maxwell, and/or family member,.  The patient and family have been given the opportunity to ask questions and make suggestions.  Tyler Maxwell [name] 10/29/2012, 11:15 PM

## 2012-10-29 NOTE — BH Assessment (Signed)
Assessment Note   Tyler Maxwell is an 27 y.o. male that presented intoxicated to ED and writer was unable to assess until pt sobered up.  This was second attempt to assess pt.  Pt now able to participate in assessment.  Pt reported he wants detox from alcohol.  Pt reports he drinks anywhere from 2-6 40 oz beers plus 8 regular beers daily.  Pt stated he last drank 6 40's plus more beer (pt unaware of amount).  Pt denies any other drug use.  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  Pt has no previous MH or SA hx.  Pt did say he tried to come to ED last month for detox, but left the ED.  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  Pt admits to passive SI, stating he has thoughts of not wanting to be here, and tried to hang himself from a tree last week by report.  He stated he couldn't kill himself because of his belief in God.  Pt denies HI.  Pt does admit to visual hallucinations, reporting he sees shadows, as well as auditory hallucinations.  He stated he hears his fiance calling his name.  Pt reports current withdrawal sx include shakiness and cramps.  Pt is not on any medications.  Pt is motivated for treatment and is pleasant and cooperative.  Completed assessment, assessment notification, and faxed to [facility] to run for possible admission.  Updated ED staff.  Axis I: 303.90 Alcohol Dependence, 296.34 Major Depressive Disorder, Recurrent, Severe With Psychotic Features Axis II: Deferred Axis III:  Past Medical History  Diagnosis Date  . No significant past medical history    Axis IV: economic problems, occupational problems, other psychosocial or environmental problems, problems with access to health care services and problems with primary support group Axis V: 31-40 impairment in reality  testing  Past Medical History:  Past Medical History  Diagnosis Date  . No significant past medical history     History reviewed. No pertinent past surgical history.  Family History: History reviewed. No pertinent family history.  Social History:  reports that he has been smoking.  He does not have any smokeless tobacco history on file. He reports that  drinks alcohol. He reports that he uses illicit drugs (Cocaine).  Additional Social History:  Alcohol / Drug Use Pain Medications: None Prescriptions: None Over the Counter: None History of alcohol / drug use?: Yes Withdrawal Symptoms: Tremors;Patient aware of relationship between substance abuse and physical/medical complications Substance #1 Name of Substance 1: ETOH 1 - Age of First Use: 13 1 - Amount (size/oz): 2-6 40 oz beers 1 - Frequency: 2-6 40 oz beers + 8 regular beers 1 - Duration: ongoing for years 1 - Last Use / Amount: 10/28/12 - 6 + 40 oz beers (pt cannot recall exact amount)  CIWA: CIWA-Ar BP: 118/73 mmHg Pulse Rate: 72 Nausea and Vomiting: no nausea and no vomiting Tactile Disturbances: none Tremor: no tremor Auditory Disturbances: not present Paroxysmal Sweats: no sweat visible Visual Disturbances: not present Anxiety: no anxiety, at ease Headache, Fullness in Head: none present Agitation: normal activity Orientation and Clouding of Sensorium: oriented and can do serial additions CIWA-Ar Total: 0 COWS:    Allergies: No Known Allergies  Home  Medications:  (Not in a hospital admission)  OB/GYN Status:  No LMP for male patient.  General Assessment Data Location of Assessment: [facility] ED Living Arrangements: Spouse/significant other (with fiance and 2 kids) Can pt return to current living arrangement?: Yes Admission Status: Voluntary Is patient capable of signing voluntary admission?: Yes Transfer from: Acute Hospital Referral Source: Self/Family/Friend  Education Status Is patient currently in  school?: No  Risk to self Suicidal Ideation: Yes-Currently Present Suicidal Intent: No Is patient at risk for suicide?: No Suicidal Plan?: No Access to Means: No What has been your use of drugs/alcohol within the last 12 months?: Dsily use of alcohol Previous Attempts/Gestures: Yes How many times?: 1 (Reported tried to hang self from tree last week) Other Self Harm Risks: pt denies Triggers for Past Attempts: Other (Comment) (fiance sheated on him, unemployed) Intentional Self Injurious Behavior: Damaging Comment - Self Injurious Behavior: Ongoing SA Family Suicide History: No Recent stressful life event(s): Conflict (Comment);Job Loss;Recent negative physical changes (Ongoing SA, fiance cheated on him, unemployed) Persecutory voices/beliefs?: No Depression: Yes Depression Symptoms: Despondent;Insomnia;Tearfulness;Guilt;Loss of interest in usual pleasures;Feeling worthless/self pity Substance abuse history and/or treatment for substance abuse?: No Suicide prevention information given to non-admitted patients: Yes  Risk to Others Homicidal Ideation: No Thoughts of Harm to Others: No Current Homicidal Intent: No Current Homicidal Plan: No Access to Homicidal Means: No Identified Victim: pt denies History of harm to others?: No Assessment of Violence: None Noted Violent Behavior Description: na - pt calm, cooperative Does patient have access to weapons?: No Criminal Charges Pending?: No Does patient have a court date: No  Psychosis Hallucinations: Visual (sees shadows) Delusions: None noted  Mental Status Report Appear/Hygiene: Disheveled Eye Contact: Good Motor Activity: Unremarkable Speech: Logical/coherent Level of Consciousness: Quiet/awake Mood: Depressed Affect: Appropriate to circumstance Anxiety Level: Minimal Thought Processes: Coherent;Relevant Judgement: Impaired Orientation: Person;Place;Time;Situation Obsessive Compulsive Thoughts/Behaviors:  None  Cognitive Functioning Concentration: Decreased Memory: Recent Intact;Remote Intact IQ: Average Insight: Fair Impulse Control: Poor Appetite: Poor Weight Loss: 0 Weight Gain: 0 Sleep: Decreased Total Hours of Sleep:  (Reports wakes every 20 mins) Vegetative Symptoms: None  ADLScreening [facility] Assessment Services) Patient's cognitive ability adequate to safely complete daily activities?: Yes Patient able to express need for assistance with ADLs?: Yes Independently performs ADLs?: Yes (appropriate for developmental age)  Abuse/Neglect [facility]) Physical Abuse: Denies Verbal Abuse: Denies Sexual Abuse: Denies  Prior Inpatient Therapy Prior Inpatient Therapy: No Prior Therapy Dates: na Prior Therapy Facilty/Provider(s): na Reason for Treatment: na  Prior Outpatient Therapy Prior Outpatient Therapy: No Prior Therapy Dates: na Prior Therapy Facilty/Provider(s): na Reason for Treatment: na  ADL Screening (condition at time of admission) Patient's cognitive ability adequate to safely complete daily activities?: Yes Patient able to express need for assistance with ADLs?: Yes Independently performs ADLs?: Yes (appropriate for developmental age)  Home Assistive Devices/Equipment Home Assistive Devices/Equipment: None    Abuse/Neglect Assessment (Assessment to be complete while patient is alone) Physical Abuse: Denies Verbal Abuse: Denies Sexual Abuse: Denies Exploitation of patient/patient's resources: Denies Self-Neglect: Denies Values / Beliefs Cultural Requests During Hospitalization: None Spiritual Requests During Hospitalization: None Consults Spiritual Care Consult Needed: No Social Work Consult Needed: No Merchant navy officer (For Healthcare) Advance Directive: Patient does not have advance directive;Patient would not like information    Additional Information 1:1 In Past 12 Months?: No CIRT Risk: No Elopement Risk: No Does patient have medical clearance?:  Yes     Disposition:  Disposition Initial Assessment Completed: Yes Disposition of Patient: Referred  to;Inpatient treatment program Type of inpatient treatment program: Adult Patient referred to: Other (Comment) (Pending [facility])  On Site Evaluation by:   Reviewed with Physician:  Delorse Limber, Rennis Harding 10/29/2012 3:18 PM

## 2012-10-29 NOTE — ED Notes (Signed)
Act team in to try to eval pt. He is still too intoxicated to participate

## 2012-10-29 NOTE — ED Notes (Signed)
When pt was asked if he had any thoughts of harming himself, he stated "I'll be straight with you, I tried to hang myself on the tree last week".  AC and CN notified, pt undressed and in blue scrubs, security called to wand pt.

## 2012-10-29 NOTE — ED Notes (Signed)
Pt states "I have a problem, a real alcohol problem.  I just need help".  States he has been drinking alcohol tonight, no drug use.

## 2012-10-29 NOTE — ED Notes (Addendum)
Patient admits that he drinks 4-5 40 ounce beers daily. States he feels suicidal when he drinks. States he tried to hang himself last weekend. Denies si or hi at this time. He is calm and cooperative with staff. Denies recreational drug use. States he has a place to live and that he has a support system of family and a fiance.

## 2012-10-29 NOTE — Progress Notes (Signed)
This is a 27 years old African American male admitted to the unit for alcohol detox. Patient reported that he has been drinking since he was 27 years old and he drinks 3-4 (40 oz) beer daily. He reported history of incarceration in 2009  for two years which was his longest period of sobriety. Patient endorsed feeling tired of drinking and wants to get help. He denied any history of Alcohol induced seizures. Reported occasional auditory hallucinations sometimes when he is drunk. He denied SI/HI, denied visual hallucinations, but endorsed some withdrawal symptoms, sweat, and stomach upset. Patient oriented to the unit, offered snacks and Q 15 minute check initiated to maintain safety. Skin assessment within normal limit, thought process organized and patient appeared calm and cooperative during this assessment.

## 2012-10-29 NOTE — ED Notes (Signed)
Report called to patrice at [facility]h 

## 2012-10-29 NOTE — ED Provider Notes (Signed)
Pt has been accepted at behavioral health by Dr. Dub Mikes, ACT team.    Ethelda Chick, MD 10/29/12 [phone]

## 2012-10-29 NOTE — BH Assessment (Signed)
Assessment Note  Update:  Received call from [facility] from Thurman Coyer, RN, [facility], stating pt accepted by PA Shelda Jakes to Dr. Dub Mikes to bed 306-1 and that pt could be transported to [facility].  Updated EDP Linker, completed support paperwork, and faxed to [facility] to log.  Updated ED staff.  Pt to be transported to [facility] via security, as pt is voluntary.    Disposition:  Disposition Initial Assessment Completed: Yes Disposition of Patient: Inpatient treatment program Type of inpatient treatment program: Adult Patient referred to: Other (Comment) (Pt accepted [facility])  On Site Evaluation by:   Reviewed with Physician:  Lora Paula 10/29/2012 5:14 PM

## 2012-10-30 ENCOUNTER — Encounter (HOSPITAL_COMMUNITY): Payer: Self-pay | Admitting: Psychiatry

## 2012-10-30 NOTE — Progress Notes (Signed)
Patient ID: Tyler Maxwell, male   DOB: 08-14-1986, 27 y.o.   MRN: [id number] He has been up and to groups interacting with peers and staff. Self inventory: depression 8, hopelessness 5, denies SI thoughts, c/o withdrawal symptoms; blurred vision,agitation. He denies thoughts of harming self spoke of wantnig to leave by Friday so that he could help care for his kids.  Has requested and received medication for gas today.
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
## 2012-10-30 NOTE — Tx Team (Signed)
Interdisciplinary Treatment Plan Update (Adult)  Date: 10/30/2012  Time Reviewed: 9:36 AM   Progress in Treatment: Attending groups: Yes Participating in groups: Yes Taking medication as prescribed:  NA  Tolerating medication:  NA Family/Significant othe contact made: Not as yet Patient understands diagnosis: Yes Discussing patient identified problems/goals with staff: Yes Medical problems stabilized or resolved:  Yes Denies suicidal/homicidal ideation: Yes Patient has not harmed self or Others: Yes  New problem(s) identified: None Identified  Discharge Plan or Barriers:  CSW is assessing for appropriate referrals.   Additional comments: N/A  Reason for Continuation of Hospitalization: Withdrawal symptoms   Estimated length of stay: 5-7 days  For review of initial/current patient goals, please see plan of care.  Attendees: Patient:     Other:  Antonieta Loveless Care Coordinator 10/30/2012 9:36 AM  Physician:  Geoffery Lyons 10/30/2012 9:36 AM   Nursing:   Robbie Louis, RN 10/30/2012 9:36 AM   Clinical Social Worker Ronda Fairly 10/30/2012 9:36 AM   Other:  Roswell Miners, RN 10/30/2012 9:36 AM   Other:  Malachi Pro, PA 10/30/2012 9:36 AM   Other:  Jari Favre, [place] PA 10/30/2012 9:36 AM   Other:  Olivia Mackie, Psych Intern 10/30/2012 9:36 AM    Scribe for Treatment Team:   Carney Bern, LCSWA  10/30/2012 9:36 AM

## 2012-10-30 NOTE — Progress Notes (Signed)
[facility] Inpatient LCSW Aftercare Discharge Planning Group Note  10/30/2012 8:35 AM  Participation Quality:  Appropriate  Affect:  Appropriate  Cognitive:  Appropriate  Insight:  Limited  Engagement in Group:  Limited  Modes of Intervention:  Clarification, Exploration, Orientation, Socialization and Support  Summary of Progress/Problems: Pt denies suicidal and homicidal ideation.  On a scale of 1 to 10 with ten being the most ever experienced, the patient rates depression at a 2 and anxiety at a 3. Tyler Maxwell shared that he recently was accepted to welding school but due to not meeting deadlines he has to wait until August to attend verses the classes he wanted to start in February.  He dealt with the disappointment by using alcohol    Clide Dales 10/30/2012, 8:35 AM

## 2012-10-30 NOTE — Progress Notes (Addendum)
[facility] LCSW Group Therapy  10/30/2012 1:15 PM  Type of Therapy:  Group Therapy 1:15 to 2:30  Participation Level:  Active  Participation Quality:  Appropriate  Affect:  Appropriate  Cognitive:  Alert and Oriented  Insight:  Developing  Engagement in Therapy:  Improving  Modes of Intervention:  Activity, Discussion, Exploration, Orientation and Support  Summary of Progress/Problems: Group members participated in activity in which patients choose photographs to represent what their life would look and feel like were it in balance and another for out of balance. Group members were able to process how daily decisions can affect the direction we are headed towards.  Mustapha shared that he believes he is ready to go and can leave drinking behind him although he later shared he has been through detox before.  Patient encouraged to consider the possibility that he does not have to do it alone.   Clide Dales
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
## 2012-10-30 NOTE — [facility] Suicide Risk Assessment (Signed)
Suicide Risk Assessment  Admission Assessment     Nursing information obtained from:    Demographic factors:    Current Mental Status:    Loss Factors:    Historical Factors:    Risk Reduction Factors:     CLINICAL FACTORS:   Alcohol/Substance Abuse/Dependencies  COGNITIVE FEATURES THAT CONTRIBUTE TO RISK: None identified   SUICIDE RISK:   Mild:  Suicidal ideation of limited frequency, intensity, duration, and specificity.  There are no identifiable plans, no associated intent, mild dysphoria and related symptoms, good self-control (both objective and subjective assessment), few other risk factors, and identifiable protective factors, including available and accessible social support.  PLAN OF CARE: Supportive approach/coping skills/relapse prevention                               Assess for withdrawal needing detox                               Reassess co morbidities  I certify that inpatient services furnished can reasonably be expected to improve the patient's condition.  Brea Coleson A 10/30/2012, 12:39 PM

## 2012-10-31 NOTE — Progress Notes (Signed)
Pt reports he is doing fine and wants to go home.  He denies SI/HI/AV.  He reports no withdrawal symptoms.  He said he went to some groups.  He has been pleasant/cooperative.  He is encouraged to make his needs known to staff.  Support/encouragement given.  Safety maintained with q15 minute checks.

## 2012-10-31 NOTE — Progress Notes (Signed)
[facility] Adult Case Management Discharge Plan :  Will you be returning to the same living situation after discharge: Yes,  with significant other and two children At discharge, do you have transportation home?:Yes,  vehicle at [facility], requesting [facility] security transport at discharge to [facility] Do you have the ability to pay for your medications: NA  Release of information consent forms completed and in the chart;  Patient's signature needed at discharge.  Patient to Follow up at: Follow-up Information   Follow up with [facility]: [facility] On 10/31/2012. (Call your therapist at [facility] day of discharge)    Contact information:   [address] [place], [place] [address] Ph [phone] Fax [phone]      Patient denies SI/HI:   Yes,  denies both    Safety Planning and Suicide Prevention discussed:  Yes,  in discharge planning group  Clide Dales 10/31/2012, 12:06 PM

## 2012-10-31 NOTE — [facility] Suicide Risk Assessment (Signed)
Suicide Risk Assessment  Discharge Assessment     Demographic Factors:  Adolescent or young adult  Mental Status Per Nursing Assessment::   On Admission:     Current Mental Status by Physician: In full contact with reality. There are no suicidal ideas plans or intent. His mood is euthymic, his affect is appropriate. He is committed to abstinece. He is going to go back home and work on getting a job He is going to go back to school to do get trained in [employer]. He is going to go to an outpatient program.  Loss Factors: Financial problems/change in socioeconomic status  Historical Factors: NA  Risk Reduction Factors:   Responsible for children under 72 years of age, Sense of responsibility to family and Living with another person, especially a relative  Continued Clinical Symptoms:  Alcohol/Substance Abuse/Dependencies  Cognitive Features That Contribute To Risk: No evidence   Suicide Risk:  Minimal: No identifiable suicidal ideation.  Patients presenting with no risk factors but with morbid ruminations; may be classified as minimal risk based on the severity of the depressive symptoms  Discharge Diagnoses:    AXIS I:  Alcohol Abuse, Substance Induced Mood disorder AXIS II:  Deferred AXIS III:   Past Medical History  Diagnosis Date  . No significant past medical history    AXIS IV:  occupational problems AXIS V:  61-70 mild symptoms  Plan Of Care/Follow-up recommendations:  Activity:  As tolerated Diet:  regular Follow up outpatient basis Is patient on multiple antipsychotic therapies at discharge:  No   Has Patient had three or more failed trials of antipsychotic monotherapy by history:  No  Recommended Plan for Multiple Antipsychotic Therapies: N/A   Tyler Maxwell A 10/31/2012, 12:37 PM

## 2012-10-31 NOTE — Progress Notes (Signed)
[facility] LCSW Aftercare Discharge Planning Group Note  10/31/2012 11:51 AM  Participation Quality:  Appropriate  Affect:  Appropriate  Cognitive:  Appropriate  Insight:  Improving  Engagement in Group:  Improving  Modes of Intervention:  Clarification, Exploration, Socialization and Support  Summary of Progress/Problems:n Pt denies suicidal and homicidal ideation.  On a scale of 1 to 10 with ten being the most ever experienced, the patient rates depression at a 0 and anxiety at a 0.  Tyler Maxwell requested a ride to [employer] at Discharge as his vehicle is there. Patient is aware of need to contact his case worker at [facility] upon discharge.      Tyler Maxwell 10/31/2012, 11:51 AM

## 2012-10-31 NOTE — Progress Notes (Signed)
Adult Psychoeducational Group Note  Date:  10/31/2012 Time:  7:28 PM  Group Topic/Focus:  Overcoming Stress:   The focus of this group is to define stress and help patients assess their triggers.  Participation Level:  Active  Participation Quality:  Appropriate and Attentive  Affect:  Appropriate  Cognitive:  Appropriate  Insight: Appropriate  Engagement in Group:  Engaged  Modes of Intervention:  Stress Interview  Additional Comments:  Charls attended group and shared throughout the group. Patient shared the negative and positive stressors in patient life. Patient completed overcoming stress interview with a peer in the group. After completing interview, patient discussed questions from worksheet and was given information about how to manage stress. Patient was involved in his treatment and discussed information on what he would like to see improvement when discharged.   Karleen Hampshire Brittini 10/31/2012, 7:28 PM

## 2012-10-31 NOTE — Discharge Summary (Signed)
Physician Discharge Summary Note  Patient:  Tyler Maxwell is an 27 y.o., male MRN:  [id number] DOB:  1986-08-01 Patient phone:  [phone] (home)  Patient address:   [address] [place] [address],   Date of Admission:  10/29/2012  Date of Discharge: 10/31/12  Reason for Admission:  Alcohol abuse  Discharge Diagnoses: Active Problems:   Alcohol abuse  Review of Systems  Constitutional: Negative.   HENT: Negative.   Eyes: Negative.   Respiratory: Negative.   Cardiovascular: Negative.   Gastrointestinal: Negative.   Genitourinary: Negative.   Musculoskeletal: Negative.   Skin: Negative.   Neurological: Negative.   Endo/Heme/Allergies: Negative.   Psychiatric/Behavioral: Positive for substance abuse. Negative for depression, suicidal ideas, hallucinations and memory loss. The patient is not nervous/anxious and does not have insomnia.    Axis Diagnosis:   AXIS I:  Alcohol Abuse AXIS II:  Deferred AXIS III:   Past Medical History  Diagnosis Date  . No significant past medical history    AXIS IV:  Alcoholism AXIS V:  64  Level of Care:  OP  Hospital Course:  Trying to stop drinking. Drinking 2 (40's) a day. It has been going on for a year or two. Lost his job, now unemployment. He was accepted to the welding class. Got upset because he lost the seat. This was frustrating. He drinks in the morning, he experiences stomach hurting, hot flashes.  Upon admission in this hospital and after admission assessment/evaluation, it was determined that Tyler Maxwell in intoxicated and will need detoxification treatment protocol to combat the withdrawal symptoms of alcohol and stabilize his system from alcohol intoxication. Tyler Maxwell was then started on Librium protocol for his alcohol detoxification. He was also enrolled in group counseling sessions and activities to learn coping skills that should help him after discharge to cope better and manage his substance abuse issues  for a much sustained sobriety. He also attended AA/NA meetings being offered and held on this unit. He has no previous and or identifiable medical conditions that required treatment and or monitoring. However, he was monitored closely for any potential problems that may arise as a result of and or during detoxification treatment. Patient tolerated his detoxification treatment without any significant adverse effects and or reactions presented.  Patient attended treatment team meeting this am and met with the treatment team members. His symptoms, substance abuse issues, response to treatment and discharge plans discussed. Patient endorsed that he is doing well and stable for discharge to pursue the next phase of his substance abuse treatment. It was then decided with patient in agreement that he will continue substance abuse treatment at the [facility] here in [place], [place]. He is required and expected to call his therapist after discharge today to inform him/her that he is being discharged from the hospital today. Tyler Maxwell was counseled on effects of alcohol on his liver which will in turn affect his whole system in general eventually. It is apparent that he comes to his senses and stop drinking. He can only achieve abstinence from alcohol by keeping up with his substance abuse treatment meetings as scheduled and meeting with his therapist as scheduled.  Tyler Maxwell is currently being discharged to his home that he shares with girl-friend and child. Upon discharge, patient adamantly denies suicidal, homicidal ideations, auditory, visual hallucinations, delusional thinking and or withdrawal symptoms. Patient left [facility] with all personal belongings in no apparent distress. Transportation per patient.   Consults:  None  Significant Diagnostic  Studies:  labs: CBC with diff, CMP, UDS, Toxicology tests  Discharge Vitals:   Blood pressure 141/94, pulse 69, temperature 97.5 F (36.4 C), temperature source Oral, resp.  rate 20, height 6\' 3"  (1.905 m), weight 79.379 kg (175 lb). Body mass index is 21.87 kg/(m^2). Lab Results:   Results for orders placed during the hospital encounter of 10/29/12 (from the past 72 hour(s))  ACETAMINOPHEN LEVEL     Status: None   Collection Time    10/29/12  2:38 AM      Result Value Range   Acetaminophen (Tylenol), Serum <15.0  10 - 30 ug/mL   Comment:            THERAPEUTIC CONCENTRATIONS VARY     SIGNIFICANTLY. A RANGE OF 10-30     ug/mL MAY BE AN EFFECTIVE     CONCENTRATION FOR MANY PATIENTS.     HOWEVER, SOME ARE BEST TREATED     AT CONCENTRATIONS OUTSIDE THIS     RANGE.     ACETAMINOPHEN CONCENTRATIONS     >150 ug/mL AT 4 HOURS AFTER     INGESTION AND >50 ug/mL AT 12     HOURS AFTER INGESTION ARE     OFTEN ASSOCIATED WITH TOXIC     REACTIONS.  CBC     Status: None   Collection Time    10/29/12  2:38 AM      Result Value Range   WBC 4.7  4.0 - 10.5 K/uL   RBC 4.72  4.22 - 5.81 MIL/uL   Hemoglobin 15.4  13.0 - 17.0 g/dL   HCT 82.9  56.2 - 13.0 %   MCV 90.7  78.0 - 100.0 fL   MCH 32.6  26.0 - 34.0 pg   MCHC 36.0  30.0 - 36.0 g/dL   RDW 86.5  78.4 - 69.6 %   Platelets 245  150 - 400 K/uL  COMPREHENSIVE METABOLIC PANEL     Status: Abnormal   Collection Time    10/29/12  2:38 AM      Result Value Range   Sodium 139  135 - 145 mEq/L   Potassium 3.7  3.5 - 5.1 mEq/L   Chloride 100  96 - 112 mEq/L   CO2 25  19 - 32 mEq/L   Glucose, Bld 107 (*) 70 - 99 mg/dL   BUN 5 (*) 6 - 23 mg/dL   Creatinine, Ser 2.95  0.50 - 1.35 mg/dL   Calcium 9.7  8.4 - 28.4 mg/dL   Total Protein 8.6 (*) 6.0 - 8.3 g/dL   Albumin 4.6  3.5 - 5.2 g/dL   AST 64 (*) 0 - 37 U/L   ALT 43  0 - 53 U/L   Alkaline Phosphatase 84  39 - 117 U/L   Total Bilirubin 0.5  0.3 - 1.2 mg/dL   GFR calc non Af Amer >90  >90 mL/min   GFR calc Af Amer >90  >90 mL/min   Comment:            The eGFR has been calculated     using the CKD EPI equation.     This calculation has not been     validated  in all clinical     situations.     eGFR's persistently     <90 mL/min signify     possible Chronic Kidney Disease.  ETHANOL     Status: Abnormal   Collection Time    10/29/12  2:38 AM  Result Value Range   Alcohol, Ethyl (B) 322 (*) 0 - 11 mg/dL   Comment:            LOWEST DETECTABLE LIMIT FOR     SERUM ALCOHOL IS 11 mg/dL     FOR MEDICAL PURPOSES ONLY  SALICYLATE LEVEL     Status: Abnormal   Collection Time    10/29/12  2:38 AM      Result Value Range   Salicylate Lvl <2.0 (*) 2.8 - 20.0 mg/dL  URINE RAPID DRUG SCREEN (HOSP PERFORMED)     Status: None   Collection Time    10/29/12  2:38 AM      Result Value Range   Opiates NONE DETECTED  NONE DETECTED   Cocaine NONE DETECTED  NONE DETECTED   Benzodiazepines NONE DETECTED  NONE DETECTED   Amphetamines NONE DETECTED  NONE DETECTED   Tetrahydrocannabinol NONE DETECTED  NONE DETECTED   Barbiturates NONE DETECTED  NONE DETECTED   Comment:            DRUG SCREEN FOR MEDICAL PURPOSES     ONLY.  IF CONFIRMATION IS NEEDED     FOR ANY PURPOSE, NOTIFY LAB     WITHIN 5 DAYS.                LOWEST DETECTABLE LIMITS     FOR URINE DRUG SCREEN     Drug Class       Cutoff (ng/mL)     Amphetamine      1000     Barbiturate      200     Benzodiazepine   200     Tricyclics       300     Opiates          300     Cocaine          300     THC              50  ETHANOL     Status: Abnormal   Collection Time    10/29/12 11:08 AM      Result Value Range   Alcohol, Ethyl (B) 166 (*) 0 - 11 mg/dL   Comment:            LOWEST DETECTABLE LIMIT FOR     SERUM ALCOHOL IS 11 mg/dL     FOR MEDICAL PURPOSES ONLY    Physical Findings: AIMS: Facial and Oral Movements Muscles of Facial Expression: None, normal Lips and Perioral Area: None, normal Jaw: None, normal Tongue: None, normal,Extremity Movements Upper (arms, wrists, hands, fingers): None, normal Lower (legs, knees, ankles, toes): None, normal, Trunk Movements Neck, shoulders,  hips: None, normal, Overall Severity Severity of abnormal movements (highest score from questions above): None, normal Incapacitation due to abnormal movements: None, normal Patient's awareness of abnormal movements (rate only patient's report): No Awareness, Dental Status Current problems with teeth and/or dentures?: No Does patient usually wear dentures?: No  CIWA:  CIWA-Ar Total: 1 COWS:  COWS Total Score: 3  Psychiatric Specialty Exam: See Psychiatric Specialty Exam and Suicide Risk Assessment completed by Attending Physician prior to discharge.  Discharge destination:  Home  Is patient on multiple antipsychotic therapies at discharge:  No   Has Patient had three or more failed trials of antipsychotic monotherapy by history:  No  Recommended Plan for Multiple Antipsychotic Therapies: NA     Medication List     As of 10/31/2012  1:04 PM  Notice      You have not been prescribed any medications.        Follow-up Information   Follow up with [facility] On 10/31/2012. (Call your therapist at [facility] day of discharge)    Contact information:   [address] [place], [place] [address] Ph [phone] Fax [phone]     Follow-up recommendations: Activity:  as tolerated Other:  Keep all scheduled follow-up appointments as recommended.   Comments:  Take all your medications as prescribed by your mental healthcare provider. Report any adverse effects and or reactions from your medicines to your outpatient provider promptly. Patient is instructed and cautioned to not engage in alcohol and or illegal drug use while on prescription medicines. In the event of worsening symptoms, patient is instructed to call the crisis hotline, 911 and or go to the nearest ED for appropriate evaluation and treatment of symptoms. Follow-up with your primary care provider for your other medical issues, concerns and or health care needs.  Continue to work the relapse prevention  plan Total Discharge Time:  Greater than 30 minutes.  SignedArmandina Stammer I 10/31/2012, 1:04 PM

## 2012-10-31 NOTE — Progress Notes (Signed)
D:  Patient discharged to home.  All belongings collected from room and from locker number 22 in the search room.  Patient denies withdrawal symptoms, depression, or suicidal thoughts.   A:  Reviewed all discharge instructions, medications, and follow up care.   R:  Patient verbalized understanding of all discharge instructions.  States he feels ready to leave.  He was escorted to the search room for his belongings and to the front lobby.  Security will be transporting him to the [employer] where his vehicle is parked.

## 2012-11-05 NOTE — Progress Notes (Signed)
Patient Discharge Instructions:  After Visit Summary (AVS):   Faxed to:  11/05/12 Discharge Summary Note:   Faxed to:  11/05/12 Psychiatric Admission Assessment Note:   Faxed to:  11/05/12 Suicide Risk Assessment - Discharge Assessment:   Faxed to:  11/05/12 Faxed/Sent to the Next Level Care provider:  11/05/12 Faxed to [facility] @ [phone]  Jerelene Redden, 11/05/2012, 4:01 PM

## 2013-03-06 DIAGNOSIS — Z0289 Encounter for other administrative examinations: Secondary | ICD-10-CM | POA: Insufficient documentation

## 2013-03-06 DIAGNOSIS — F17200 Nicotine dependence, unspecified, uncomplicated: Secondary | ICD-10-CM | POA: Insufficient documentation

## 2013-03-07 ENCOUNTER — Emergency Department (HOSPITAL_COMMUNITY)
Admission: EM | Admit: 2013-03-07 | Discharge: 2013-03-07 | Discharge status: Against Medical Advice | Payer: Self-pay | Attending: Emergency Medicine | Admitting: Emergency Medicine

## 2013-03-07 NOTE — ED Notes (Signed)
Pt seen walking out of ER doors; pt never returned to be seen

## 2013-03-19 ENCOUNTER — Encounter (HOSPITAL_COMMUNITY): Payer: Self-pay

## 2013-03-19 ENCOUNTER — Emergency Department (HOSPITAL_COMMUNITY)
Admission: EM | Admit: 2013-03-19 | Discharge: 2013-03-20 | Disposition: A | Discharge status: Home / Self Care | Payer: Self-pay | Attending: Emergency Medicine | Admitting: Emergency Medicine

## 2013-03-19 DIAGNOSIS — F1010 Alcohol abuse, uncomplicated: Secondary | ICD-10-CM

## 2013-03-19 DIAGNOSIS — F39 Unspecified mood [affective] disorder: Secondary | ICD-10-CM | POA: Insufficient documentation

## 2013-03-19 DIAGNOSIS — F10229 Alcohol dependence with intoxication, unspecified: Secondary | ICD-10-CM | POA: Insufficient documentation

## 2013-03-19 DIAGNOSIS — F329 Major depressive disorder, single episode, unspecified: Secondary | ICD-10-CM | POA: Insufficient documentation

## 2013-03-19 DIAGNOSIS — F10120 Alcohol abuse with intoxication, uncomplicated: Secondary | ICD-10-CM

## 2013-03-19 DIAGNOSIS — F17200 Nicotine dependence, unspecified, uncomplicated: Secondary | ICD-10-CM | POA: Insufficient documentation

## 2013-03-19 DIAGNOSIS — F3289 Other specified depressive episodes: Secondary | ICD-10-CM | POA: Insufficient documentation

## 2013-03-19 NOTE — ED Notes (Signed)
Pt is requesting detox, he states that he gets stressed and wants to drink, he drinks beer everyday
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
## 2013-03-20 ENCOUNTER — Encounter (HOSPITAL_COMMUNITY): Payer: Self-pay | Admitting: Registered Nurse

## 2013-03-20 DIAGNOSIS — F1010 Alcohol abuse, uncomplicated: Secondary | ICD-10-CM

## 2013-03-20 LAB — CBC
HCT: 39.7 % (ref 39.0–52.0)
Hemoglobin: 13.7 g/dL (ref 13.0–17.0)
MCH: 31.6 pg (ref 26.0–34.0)
MCHC: 34.5 g/dL (ref 30.0–36.0)
RDW: 12.7 % (ref 11.5–15.5)

## 2013-03-20 LAB — RAPID URINE DRUG SCREEN, HOSP PERFORMED
Barbiturates: NOT DETECTED
Cocaine: NOT DETECTED
Tetrahydrocannabinol: NOT DETECTED

## 2013-03-20 LAB — COMPREHENSIVE METABOLIC PANEL
Albumin: 4.4 g/dL (ref 3.5–5.2)
BUN: 5 mg/dL — ABNORMAL LOW (ref 6–23)
Calcium: 9.8 mg/dL (ref 8.4–10.5)
GFR calc Af Amer: >90 mL/min (ref >90)
Glucose, Bld: 93 mg/dL (ref 70–99)
Sodium: 139 mEq/L (ref 135–145)
Total Protein: 8 g/dL (ref 6.0–8.3)

## 2013-03-20 LAB — ETHANOL: Alcohol, Ethyl (B): 311 mg/dL — ABNORMAL HIGH (ref 0–11)

## 2013-03-20 MED ORDER — ZOLPIDEM TARTRATE 5 MG PO TABS: 5.0000 mg | ORAL_TABLET | Freq: Every evening | ORAL | Status: DC | PRN

## 2013-03-20 MED ORDER — IBUPROFEN 200 MG PO TABS: 600.0000 mg | ORAL_TABLET | Freq: Three times a day (TID) | ORAL | Status: DC | PRN

## 2013-03-20 MED ORDER — LORAZEPAM 1 MG PO TABS: 1.0000 mg | ORAL_TABLET | Freq: Three times a day (TID) | ORAL | Status: DC | PRN

## 2013-03-20 MED ORDER — NICOTINE 21 MG/24HR TD PT24
21.0000 mg | MEDICATED_PATCH | Freq: Every day | TRANSDERMAL | Status: DC
Start: 2013-03-20 — End: 2013-03-20
  Administered 2013-03-20: 21 mg via TRANSDERMAL

## 2013-03-20 MED ORDER — ONDANSETRON HCL 4 MG PO TABS: 4.0000 mg | ORAL_TABLET | Freq: Three times a day (TID) | ORAL | Status: DC | PRN

## 2013-03-20 MED ORDER — ALUM & MAG HYDROXIDE-SIMETH 200-200-20 MG/5ML PO SUSP: 30.0000 mL | ORAL | Status: DC | PRN

## 2013-03-20 NOTE — ED Provider Notes (Signed)
History    CSN: [id number] Arrival date & time 03/19/13  2344  First MD Initiated Contact with Patient 03/20/13 0020     Chief Complaint  Patient presents with  . Medical Clearance   HPI  History provided by the patient. Patient is a 27 year old male with no significant PMH presents with requests for help with alcohol detox and addiction. Patient reports drinking between 3-4, 40 ounce beers a day. He states this has been interfering with his personal life especially with his relationship with his fiance. He states that she gave him an ultimatum to quit drinking or she would break up. Patient is sad depressed about this but ultimately does wish to stop drinking. He has been seen for similar symptoms in the past and admitted to [facility]. states he was only sober for 4 days after leaving. He has never followed up outpatient or with any AA group. He currently denies any SI or HI to mean. Denies any other complaints. Denies any other drug use.    Past Medical History  Diagnosis Date  . No significant past medical history    History reviewed. No pertinent past surgical history. History reviewed. No pertinent family history. History  Substance Use Topics  . Smoking status: Current Some Day Smoker  . Smokeless tobacco: Not on file  . Alcohol Use: 60.0 oz/week    120 drink(s) per week     Comment: 3-4 40's a day    Review of Systems  Respiratory: Negative for shortness of breath.   Cardiovascular: Negative for chest pain.  Gastrointestinal: Negative for nausea, vomiting and abdominal pain.  Psychiatric/Behavioral: Positive for dysphoric mood. Negative for suicidal ideas, hallucinations and self-injury. The patient is not nervous/anxious.   All other systems reviewed and are negative.    Allergies  Review of patient's allergies indicates no known allergies.  Home Medications   Current Outpatient Rx  Name  Route  Sig  Dispense  Refill  . naproxen sodium (ANAPROX) 220 MG tablet    Oral   Take 220 mg by mouth 2 (two) times daily as needed. For headache pain          BP 117/72  Pulse 82  Temp(Src) 97.8 F (36.6 C) (Oral)  Resp 20  SpO2 97% Physical Exam  Nursing note and vitals reviewed. Constitutional: He is oriented to person, place, and time. He appears well-developed and well-nourished. No distress.  HENT:  Head: Normocephalic.  Cardiovascular: Normal rate and regular rhythm.   Pulmonary/Chest: Effort normal and breath sounds normal. No respiratory distress. He has no wheezes. He has no rales.  Abdominal: Soft. There is no tenderness. There is no rebound and no guarding.  Musculoskeletal: Normal range of motion.  Neurological: He is alert and oriented to person, place, and time. He has normal strength. Gait normal.  Skin: Skin is warm.  Psychiatric: His behavior is normal. He exhibits a depressed mood. He expresses no homicidal and no suicidal ideation.  Tearful    ED Course  Procedures  Results for orders placed during the hospital encounter of 10/29/12  ACETAMINOPHEN LEVEL      Result Value Range   Acetaminophen (Tylenol), Serum <15.0  10 - 30 ug/mL  CBC      Result Value Range   WBC 4.7  4.0 - 10.5 K/uL   RBC 4.72  4.22 - 5.81 MIL/uL   Hemoglobin 15.4  13.0 - 17.0 g/dL   HCT 14.7  82.9 - 56.2 %   MCV 90.7  78.0 - 100.0 fL   MCH 32.6  26.0 - 34.0 pg   MCHC 36.0  30.0 - 36.0 g/dL   RDW 45.4  09.8 - 11.9 %   Platelets 245  150 - 400 K/uL  COMPREHENSIVE METABOLIC PANEL      Result Value Range   Sodium 139  135 - 145 mEq/L   Potassium 3.7  3.5 - 5.1 mEq/L   Chloride 100  96 - 112 mEq/L   CO2 25  19 - 32 mEq/L   Glucose, Bld 107 (*) 70 - 99 mg/dL   BUN 5 (*) 6 - 23 mg/dL   Creatinine, Ser 1.47  0.50 - 1.35 mg/dL   Calcium 9.7  8.4 - 82.9 mg/dL   Total Protein 8.6 (*) 6.0 - 8.3 g/dL   Albumin 4.6  3.5 - 5.2 g/dL   AST 64 (*) 0 - 37 U/L   ALT 43  0 - 53 U/L   Alkaline Phosphatase 84  39 - 117 U/L   Total Bilirubin 0.5  0.3 - 1.2 mg/dL    GFR calc non Af Amer >90  >90 mL/min   GFR calc Af Amer >90  >90 mL/min  ETHANOL      Result Value Range   Alcohol, Ethyl (B) 322 (*) 0 - 11 mg/dL  SALICYLATE LEVEL      Result Value Range   Salicylate Lvl <2.0 (*) 2.8 - 20.0 mg/dL  URINE RAPID DRUG SCREEN (HOSP PERFORMED)      Result Value Range   Opiates NONE DETECTED  NONE DETECTED   Cocaine NONE DETECTED  NONE DETECTED   Benzodiazepines NONE DETECTED  NONE DETECTED   Amphetamines NONE DETECTED  NONE DETECTED   Tetrahydrocannabinol NONE DETECTED  NONE DETECTED   Barbiturates NONE DETECTED  NONE DETECTED  ETHANOL      Result Value Range   Alcohol, Ethyl (B) 166 (*) 0 - 11 mg/dL        1. Alcohol abuse with intoxication, uncomplicated        MDM  12:20AM patient seen and evaluated. Patient appears well in no acute distress. Patient does appear slightly intoxicated and is tearful. Denies SI or HI.  Spoke with Aurther Loft with [facility] act team.  She will see pt and evaluate.  Psych holding orders written.  Pt is voluntary.    Angus Seller, PA-C 03/20/13 [phone]

## 2013-03-20 NOTE — Consult Note (Signed)
Reason for Consult: Evaluation for inpatient treatment Referring Physician:  EDP  Tyler Maxwell is an 27 y.o. male.  HPI:  Patient presents to [facility] requesting detox.  Patient states that he is tired of drinking.  Patient states that he has 2 DUI and lost license with last incident and is to appear in court on 04/04/2013.  Patient states that he does not need detox but would like resources to contact for rehab services.  Patient denies suicidal ideation, homicidal ideations, psychosis, and paranoia.  Patient denies s/s of withdrawal.   Past Medical History  Diagnosis Date  . No significant past medical history     History reviewed. No pertinent past surgical history.  History reviewed. No pertinent family history.  Social History:  reports that he has been smoking.  He does not have any smokeless tobacco history on file. He reports that he drinks about 60.0 ounces of alcohol per week. He reports that he uses illicit drugs (Cocaine).  Allergies: No Known Allergies  Medications: I have reviewed the patient's current medications.  Results for orders placed during the hospital encounter of 03/19/13 (from the past 48 hour(s))  URINE RAPID DRUG SCREEN (HOSP PERFORMED)     Status: None   Collection Time    03/20/13 12:00 AM      Result Value Range   Opiates NONE DETECTED  NONE DETECTED   Cocaine NONE DETECTED  NONE DETECTED   Benzodiazepines NONE DETECTED  NONE DETECTED   Amphetamines NONE DETECTED  NONE DETECTED   Tetrahydrocannabinol NONE DETECTED  NONE DETECTED   Barbiturates NONE DETECTED  NONE DETECTED   Comment:            DRUG SCREEN FOR MEDICAL PURPOSES     ONLY.  IF CONFIRMATION IS NEEDED     FOR ANY PURPOSE, NOTIFY LAB     WITHIN 5 DAYS.                LOWEST DETECTABLE LIMITS     FOR URINE DRUG SCREEN     Drug Class       Cutoff (ng/mL)     Amphetamine      1000     Barbiturate      200     Benzodiazepine   200     Tricyclics       300     Opiates          300   Cocaine          300     THC              50  CBC     Status: None   Collection Time    03/20/13 12:17 AM      Result Value Range   WBC 4.4  4.0 - 10.5 K/uL   RBC 4.34  4.22 - 5.81 MIL/uL   Hemoglobin 13.7  13.0 - 17.0 g/dL   HCT 16.1  09.6 - 04.5 %   MCV 91.5  78.0 - 100.0 fL   MCH 31.6  26.0 - 34.0 pg   MCHC 34.5  30.0 - 36.0 g/dL   RDW 40.9  81.1 - 91.4 %   Platelets 250  150 - 400 K/uL  COMPREHENSIVE METABOLIC PANEL     Status: Abnormal   Collection Time    03/20/13 12:17 AM      Result Value Range   Sodium 139  135 - 145 mEq/L   Potassium 3.3 (*) 3.5 -  5.1 mEq/L   Chloride 100  96 - 112 mEq/L   CO2 24  19 - 32 mEq/L   Glucose, Bld 93  70 - 99 mg/dL   BUN 5 (*) 6 - 23 mg/dL   Creatinine, Ser 1.61  0.50 - 1.35 mg/dL   Calcium 9.8  8.4 - 09.6 mg/dL   Total Protein 8.0  6.0 - 8.3 g/dL   Albumin 4.4  3.5 - 5.2 g/dL   AST 39 (*) 0 - 37 U/L   ALT 36  0 - 53 U/L   Alkaline Phosphatase 76  39 - 117 U/L   Total Bilirubin 0.5  0.3 - 1.2 mg/dL   GFR calc non Af Amer >90  >90 mL/min   GFR calc Af Amer >90  >90 mL/min   Comment:            The eGFR has been calculated     using the CKD EPI equation.     This calculation has not been     validated in all clinical     situations.     eGFR's persistently     <90 mL/min signify     possible Chronic Kidney Disease.  ETHANOL     Status: Abnormal   Collection Time    03/20/13 12:17 AM      Result Value Range   Alcohol, Ethyl (B) 311 (*) 0 - 11 mg/dL   Comment:            LOWEST DETECTABLE LIMIT FOR     SERUM ALCOHOL IS 11 mg/dL     FOR MEDICAL PURPOSES ONLY    No results found.  Review of Systems  Psychiatric/Behavioral: Positive for depression and substance abuse. Negative for suicidal ideas (ETOH), hallucinations and memory loss. The patient is not nervous/anxious and does not have insomnia.   All other systems reviewed and are negative.   Blood pressure 124/73, pulse 65, temperature 98.3 F (36.8 C), temperature  source Oral, resp. rate 16, SpO2 97.00%. Physical Exam  Constitutional: He is oriented to person, place, and time. He appears well-developed and well-nourished.  HENT:  Head: Normocephalic and atraumatic.  Neck: Normal range of motion.  Musculoskeletal: Normal range of motion.  Neurological: He is alert and oriented to person, place, and time.  Skin: Skin is warm and dry.  Psychiatric: His speech is normal and behavior is normal. Thought content normal. He is not actively hallucinating. Thought content is not paranoid and not delusional. Cognition and memory are normal. He exhibits a depressed mood. He expresses no homicidal and no suicidal ideation.   Face to face interview and consult with Dr. Lucianne Muss Assessment/Plan: Axis I: Alcohol Abuse Recommendation:  Discharge home with outpatient resources and assistant programs    Assunta Found, FNP-BC 03/20/2013, 1:34 PM

## 2013-03-20 NOTE — ED Notes (Signed)
Written dc instructions reviewed w/ pt, pt encouraged to follow up w/ OP referals as instructed.  Pt verbalized understanding.  Ambulatory to dc window w/ NT, belongings returned after leaving the unit.

## 2013-03-20 NOTE — ED Notes (Signed)
Up to the desk to call for ride home 

## 2013-03-20 NOTE — ED Notes (Signed)
[name] NP into see

## 2013-03-20 NOTE — ED Notes (Signed)
[facility] into see 

## 2013-03-20 NOTE — ED Notes (Signed)
Up to the desk on the phone 

## 2013-03-20 NOTE — ED Notes (Signed)
Pt has decided that he does not want to stay.  [name] NP and dr Lucianne Muss are aware

## 2013-03-20 NOTE — ED Notes (Signed)
Dr Lucianne Muss and Derwood Kaplan NP into see

## 2013-03-23 NOTE — Consult Note (Signed)
Patient seen, evaluated and treatment plan formulated by me 

## 2013-03-24 NOTE — ED Provider Notes (Signed)
Medical screening examination/treatment/procedure(s) were performed by non-physician practitioner and as supervising physician I was immediately available for consultation/collaboration.   Graiden Henes M Kiefer Opheim, MD 03/24/13 1610 

## 2013-09-07 ENCOUNTER — Emergency Department (HOSPITAL_COMMUNITY)
Admission: EM | Admit: 2013-09-07 | Discharge: 2013-09-07 | Disposition: A | Discharge status: Home / Self Care | Payer: Self-pay | Attending: Emergency Medicine | Admitting: Emergency Medicine

## 2013-09-07 ENCOUNTER — Encounter (HOSPITAL_COMMUNITY): Payer: Self-pay | Admitting: Emergency Medicine

## 2013-09-07 DIAGNOSIS — Y9389 Activity, other specified: Secondary | ICD-10-CM | POA: Insufficient documentation

## 2013-09-07 DIAGNOSIS — F17200 Nicotine dependence, unspecified, uncomplicated: Secondary | ICD-10-CM | POA: Insufficient documentation

## 2013-09-07 DIAGNOSIS — R519 Headache, unspecified: Secondary | ICD-10-CM

## 2013-09-07 DIAGNOSIS — R51 Headache: Secondary | ICD-10-CM

## 2013-09-07 DIAGNOSIS — Y92410 Unspecified street and highway as the place of occurrence of the external cause: Secondary | ICD-10-CM | POA: Insufficient documentation

## 2013-09-07 DIAGNOSIS — M549 Dorsalgia, unspecified: Secondary | ICD-10-CM

## 2013-09-07 DIAGNOSIS — IMO0002 Reserved for concepts with insufficient information to code with codable children: Secondary | ICD-10-CM | POA: Insufficient documentation

## 2013-09-07 DIAGNOSIS — S0990XA Unspecified injury of head, initial encounter: Secondary | ICD-10-CM | POA: Insufficient documentation

## 2013-09-07 MED ORDER — CYCLOBENZAPRINE HCL 10 MG PO TABS: 10.0000 mg | ORAL_TABLET | Freq: Three times a day (TID) | ORAL | Status: DC | PRN

## 2013-09-07 MED ORDER — IBUPROFEN 800 MG PO TABS: 800.0000 mg | ORAL_TABLET | Freq: Three times a day (TID) | ORAL | Status: DC | PRN

## 2013-09-07 MED ORDER — IBUPROFEN 400 MG PO TABS
800.0000 mg | ORAL_TABLET | Freq: Once | ORAL | Status: AC
  Administered 2013-09-07: 800 mg via ORAL
  Filled 2013-09-07: qty 2
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
## 2013-09-08 NOTE — ED Provider Notes (Signed)
Medical screening examination/treatment/procedure(s) were performed by non-physician practitioner and as supervising physician I was immediately available for consultation/collaboration.  EKG Interpretation   None        Tymber Stallings, MD 09/08/13 0028 

## 2013-10-12 ENCOUNTER — Emergency Department (HOSPITAL_COMMUNITY)
Admission: EM | Admit: 2013-10-12 | Discharge: 2013-10-12 | Disposition: A | Discharge status: Other Acute Inpatient Hospital | Payer: Self-pay | Attending: Emergency Medicine | Admitting: Emergency Medicine

## 2013-10-12 ENCOUNTER — Encounter (HOSPITAL_COMMUNITY): Payer: Self-pay | Admitting: Emergency Medicine

## 2013-10-12 ENCOUNTER — Inpatient Hospital Stay (HOSPITAL_COMMUNITY)
Admission: AD | Admit: 2013-10-12 | Discharge: 2013-10-16 | DRG: 897 | Disposition: A | Discharge status: Home / Self Care | Payer: No Typology Code available for payment source | Source: Intra-hospital | Attending: Psychiatry | Admitting: Psychiatry

## 2013-10-12 ENCOUNTER — Encounter (HOSPITAL_COMMUNITY): Payer: Self-pay | Admitting: *Deleted

## 2013-10-12 DIAGNOSIS — F1010 Alcohol abuse, uncomplicated: Secondary | ICD-10-CM

## 2013-10-12 DIAGNOSIS — F1020 Alcohol dependence, uncomplicated: Secondary | ICD-10-CM

## 2013-10-12 DIAGNOSIS — F17200 Nicotine dependence, unspecified, uncomplicated: Secondary | ICD-10-CM | POA: Diagnosis present

## 2013-10-12 DIAGNOSIS — F10929 Alcohol use, unspecified with intoxication, unspecified: Secondary | ICD-10-CM

## 2013-10-12 DIAGNOSIS — F411 Generalized anxiety disorder: Secondary | ICD-10-CM | POA: Diagnosis present

## 2013-10-12 DIAGNOSIS — F10939 Alcohol use, unspecified with withdrawal, unspecified: Principal | ICD-10-CM | POA: Diagnosis present

## 2013-10-12 DIAGNOSIS — F1994 Other psychoactive substance use, unspecified with psychoactive substance-induced mood disorder: Secondary | ICD-10-CM | POA: Diagnosis present

## 2013-10-12 DIAGNOSIS — Z23 Encounter for immunization: Secondary | ICD-10-CM | POA: Diagnosis present

## 2013-10-12 DIAGNOSIS — F10239 Alcohol dependence with withdrawal, unspecified: Principal | ICD-10-CM | POA: Diagnosis present

## 2013-10-12 DIAGNOSIS — F329 Major depressive disorder, single episode, unspecified: Secondary | ICD-10-CM | POA: Diagnosis present

## 2013-10-12 DIAGNOSIS — F10229 Alcohol dependence with intoxication, unspecified: Secondary | ICD-10-CM | POA: Insufficient documentation

## 2013-10-12 HISTORY — DX: Major depressive disorder, single episode, unspecified: F32.9

## 2013-10-12 HISTORY — DX: Depression, unspecified: F32.A

## 2013-10-12 LAB — CBC WITH DIFFERENTIAL/PLATELET
BASOS PCT: 2 % — AB (ref 0–1)
Basophils Absolute: 0.1 10*3/uL (ref 0.0–0.1)
EOS ABS: 0.2 10*3/uL (ref 0.0–0.7)
Eosinophils Relative: 5 % (ref 0–5)
HEMATOCRIT: 39.3 % (ref 39.0–52.0)
HEMOGLOBIN: 14 g/dL (ref 13.0–17.0)
LYMPHS ABS: 2 10*3/uL (ref 0.7–4.0)
Lymphocytes Relative: 44 % (ref 12–46)
MCH: 32.9 pg (ref 26.0–34.0)
MCHC: 35.6 g/dL (ref 30.0–36.0)
MCV: 92.3 fL (ref 78.0–100.0)
MONO ABS: 0.5 10*3/uL (ref 0.1–1.0)
Monocytes Relative: 11 % (ref 3–12)
NEUTROS ABS: 1.7 10*3/uL (ref 1.7–7.7)
Neutrophils Relative %: 38 % — ABNORMAL LOW (ref 43–77)
Platelets: 241 10*3/uL (ref 150–400)
RBC: 4.26 MIL/uL (ref 4.22–5.81)
RDW: 13.5 % (ref 11.5–15.5)
WBC: 4.5 10*3/uL (ref 4.0–10.5)

## 2013-10-12 LAB — URINALYSIS, ROUTINE W REFLEX MICROSCOPIC
Bilirubin Urine: NEGATIVE
GLUCOSE, UA: NEGATIVE mg/dL
HGB URINE DIPSTICK: NEGATIVE
Ketones, ur: NEGATIVE mg/dL
Leukocytes, UA: NEGATIVE
Nitrite: NEGATIVE
PROTEIN: NEGATIVE mg/dL
Specific Gravity, Urine: 1.005 (ref 1.005–1.030)
Urobilinogen, UA: 0.2 mg/dL (ref 0.0–1.0)
pH: 6.5 (ref 5.0–8.0)

## 2013-10-12 LAB — RAPID URINE DRUG SCREEN, HOSP PERFORMED
AMPHETAMINES: NOT DETECTED
Barbiturates: NOT DETECTED
Benzodiazepines: NOT DETECTED
COCAINE: NOT DETECTED
OPIATES: NOT DETECTED
TETRAHYDROCANNABINOL: NOT DETECTED

## 2013-10-12 LAB — COMPREHENSIVE METABOLIC PANEL
ALBUMIN: 4.4 g/dL (ref 3.5–5.2)
ALT: 69 U/L — ABNORMAL HIGH (ref 0–53)
AST: 95 U/L — ABNORMAL HIGH (ref 0–37)
Alkaline Phosphatase: 84 U/L (ref 39–117)
BILIRUBIN TOTAL: 0.3 mg/dL (ref 0.3–1.2)
BUN: 6 mg/dL (ref 6–23)
CO2: 24 mEq/L (ref 19–32)
CREATININE: 0.76 mg/dL (ref 0.50–1.35)
Calcium: 8.9 mg/dL (ref 8.4–10.5)
Chloride: 97 mEq/L (ref 96–112)
GFR calc Af Amer: >90 mL/min (ref >90)
GFR calc non Af Amer: >90 mL/min (ref >90)
Glucose, Bld: 124 mg/dL — ABNORMAL HIGH (ref 70–99)
Potassium: 3.7 mEq/L (ref 3.7–5.3)
Sodium: 140 mEq/L (ref 137–147)
TOTAL PROTEIN: 8.2 g/dL (ref 6.0–8.3)

## 2013-10-12 LAB — ETHANOL: Alcohol, Ethyl (B): 404 mg/dL (ref 0–11)

## 2013-10-12 LAB — MAGNESIUM: MAGNESIUM: 2.4 mg/dL (ref 1.5–2.5)

## 2013-10-12 MED ORDER — ONDANSETRON HCL 4 MG PO TABS: 4.0000 mg | ORAL_TABLET | Freq: Three times a day (TID) | ORAL | Status: DC | PRN

## 2013-10-12 MED ORDER — THIAMINE HCL 100 MG/ML IJ SOLN: 100.0000 mg | Freq: Once | INTRAMUSCULAR | Status: DC

## 2013-10-12 MED ORDER — THIAMINE HCL 100 MG/ML IJ SOLN
100.0000 mg | Freq: Once | INTRAMUSCULAR | Status: AC
  Administered 2013-10-12: 100 mg via INTRAVENOUS
  Filled 2013-10-12: qty 2

## 2013-10-12 MED ORDER — CHLORDIAZEPOXIDE HCL 25 MG PO CAPS
25.0000 mg | ORAL_CAPSULE | ORAL | Status: AC
  Administered 2013-10-15 (×2): 25 mg via ORAL
  Filled 2013-10-12 (×2): qty 1

## 2013-10-12 MED ORDER — MAGNESIUM HYDROXIDE 400 MG/5ML PO SUSP
30.0000 mL | Freq: Every day | ORAL | Status: DC | PRN
Start: 2013-10-12 — End: 2013-10-16

## 2013-10-12 MED ORDER — CHLORDIAZEPOXIDE HCL 25 MG PO CAPS
25.0000 mg | ORAL_CAPSULE | Freq: Every day | ORAL | Status: AC
  Administered 2013-10-16: 25 mg via ORAL
  Filled 2013-10-12: qty 1

## 2013-10-12 MED ORDER — CHLORDIAZEPOXIDE HCL 25 MG PO CAPS
25.0000 mg | ORAL_CAPSULE | Freq: Three times a day (TID) | ORAL | Status: AC
  Administered 2013-10-14 (×3): 25 mg via ORAL
  Filled 2013-10-12 (×4): qty 1

## 2013-10-12 MED ORDER — ADULT MULTIVITAMIN W/MINERALS CH
1.0000 | ORAL_TABLET | Freq: Every day | ORAL | Status: DC
  Administered 2013-10-12: 1 via ORAL
  Filled 2013-10-12: qty 1

## 2013-10-12 MED ORDER — ADULT MULTIVITAMIN W/MINERALS CH
1.0000 | ORAL_TABLET | Freq: Every day | ORAL | Status: DC
  Administered 2013-10-13 – 2013-10-16 (×4): 1 via ORAL
  Filled 2013-10-12 (×7): qty 1

## 2013-10-12 MED ORDER — LORAZEPAM 1 MG PO TABS: 0.0000 mg | ORAL_TABLET | Freq: Two times a day (BID) | ORAL | Status: DC

## 2013-10-12 MED ORDER — SODIUM CHLORIDE 0.9 % IV BOLUS (SEPSIS)
1000.0000 mL | Freq: Once | INTRAVENOUS | Status: AC
  Administered 2013-10-12: 1000 mL via INTRAVENOUS

## 2013-10-12 MED ORDER — CHLORDIAZEPOXIDE HCL 25 MG PO CAPS
50.0000 mg | ORAL_CAPSULE | Freq: Once | ORAL | Status: AC
  Administered 2013-10-12: 50 mg via ORAL
  Filled 2013-10-12: qty 2

## 2013-10-12 MED ORDER — ONDANSETRON 4 MG PO TBDP
4.0000 mg | ORAL_TABLET | Freq: Four times a day (QID) | ORAL | Status: AC | PRN
  Administered 2013-10-12: 4 mg via ORAL
  Filled 2013-10-12: qty 1

## 2013-10-12 MED ORDER — LORAZEPAM 1 MG PO TABS
0.0000 mg | ORAL_TABLET | Freq: Four times a day (QID) | ORAL | Status: DC
Start: 2013-10-12 — End: 2013-10-12

## 2013-10-12 MED ORDER — CHLORDIAZEPOXIDE HCL 25 MG PO CAPS
25.0000 mg | ORAL_CAPSULE | Freq: Four times a day (QID) | ORAL | Status: AC
  Administered 2013-10-12 – 2013-10-13 (×6): 25 mg via ORAL
  Filled 2013-10-12 (×4): qty 1

## 2013-10-12 MED ORDER — LORAZEPAM 1 MG PO TABS
1.0000 mg | ORAL_TABLET | Freq: Four times a day (QID) | ORAL | Status: DC | PRN
Start: 2013-10-12 — End: 2013-10-12

## 2013-10-12 MED ORDER — VITAMIN B-1 100 MG PO TABS
100.0000 mg | ORAL_TABLET | Freq: Every day | ORAL | Status: DC
Start: 2013-10-13 — End: 2013-10-16
  Administered 2013-10-13 – 2013-10-16 (×4): 100 mg via ORAL
  Filled 2013-10-12 (×6): qty 1

## 2013-10-12 MED ORDER — FOLIC ACID 1 MG PO TABS
1.0000 mg | ORAL_TABLET | Freq: Every day | ORAL | Status: DC
  Administered 2013-10-12: 1 mg via ORAL
  Filled 2013-10-12: qty 1

## 2013-10-12 MED ORDER — VITAMIN B-1 100 MG PO TABS: 100.0000 mg | ORAL_TABLET | Freq: Every day | ORAL | Status: DC

## 2013-10-12 MED ORDER — ALUM & MAG HYDROXIDE-SIMETH 200-200-20 MG/5ML PO SUSP
30.0000 mL | ORAL | Status: DC | PRN
Start: 2013-10-12 — End: 2013-10-16

## 2013-10-12 MED ORDER — NICOTINE 21 MG/24HR TD PT24
21.0000 mg | MEDICATED_PATCH | Freq: Every day | TRANSDERMAL | Status: DC
  Administered 2013-10-12: 21 mg via TRANSDERMAL
  Filled 2013-10-12: qty 1

## 2013-10-12 MED ORDER — LORAZEPAM 2 MG/ML IJ SOLN: 1.0000 mg | Freq: Four times a day (QID) | INTRAMUSCULAR | Status: DC | PRN

## 2013-10-12 MED ORDER — HYDROXYZINE HCL 25 MG PO TABS
25.0000 mg | ORAL_TABLET | Freq: Four times a day (QID) | ORAL | Status: AC | PRN
  Administered 2013-10-12 – 2013-10-14 (×3): 25 mg via ORAL
  Filled 2013-10-12 (×4): qty 1

## 2013-10-12 MED ORDER — THIAMINE HCL 100 MG/ML IJ SOLN: 100.0000 mg | Freq: Every day | INTRAMUSCULAR | Status: DC

## 2013-10-12 MED ORDER — CHLORDIAZEPOXIDE HCL 25 MG PO CAPS
25.0000 mg | ORAL_CAPSULE | Freq: Four times a day (QID) | ORAL | Status: AC | PRN
Start: 2013-10-12 — End: 2013-10-15
  Filled 2013-10-12: qty 1

## 2013-10-12 MED ORDER — LOPERAMIDE HCL 2 MG PO CAPS: 2.0000 mg | ORAL_CAPSULE | ORAL | Status: AC | PRN

## 2013-10-12 MED ORDER — ACETAMINOPHEN 325 MG PO TABS
650.0000 mg | ORAL_TABLET | Freq: Four times a day (QID) | ORAL | Status: DC | PRN
  Administered 2013-10-14 – 2013-10-15 (×3): 650 mg via ORAL
  Filled 2013-10-12 (×3): qty 2

## 2013-10-12 MED ORDER — PNEUMOCOCCAL VAC POLYVALENT 25 MCG/0.5ML IJ INJ
0.5000 mL | INJECTION | INTRAMUSCULAR | Status: AC
  Administered 2013-10-13: 0.5 mL via INTRAMUSCULAR

## 2013-10-12 NOTE — Progress Notes (Signed)
Pt valuables envelope given to pt fiancee prior to her leaving this facility. All contents in envelope were returned according to pt request

## 2013-10-12 NOTE — Tx Team (Signed)
Initial Interdisciplinary Treatment Plan  PATIENT STRENGTHS: (choose at least two) Ability for insight Average or above average intelligence Capable of independent living General fund of knowledge Motivation for treatment/growth  PATIENT STRESSORS: Substance abuse   PROBLEM LIST: Problem List/Patient Goals Date to be addressed Date deferred Reason deferred Estimated date of resolution  ETOH abuse 10/12/13                                                      DISCHARGE CRITERIA:  Ability to meet basic life and health needs Improved stabilization in mood, thinking, and/or behavior Verbal commitment to aftercare and medication compliance Withdrawal symptoms are absent or subacute and managed without 24-hour nursing intervention  PRELIMINARY DISCHARGE PLAN: Attend aftercare/continuing care group Return to previous living arrangement  PATIENT/FAMIILY INVOLVEMENT: This treatment plan has been presented to and reviewed with the patient, Tyler Maxwell, and/or family member, .  The patient and family have been given the opportunity to ask questions and make suggestions.  Tyler Maxwell, [name] 10/12/2013, 4:50 PM
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
## 2013-10-12 NOTE — ED Notes (Signed)
TELEPSYCH IS SCHEDULED FOR 155

## 2013-10-12 NOTE — ED Provider Notes (Signed)
CSN: [id number]     Arrival date & time 10/12/13  0122 History   First MD Initiated Contact with Patient 10/12/13 0157     Chief Complaint  Patient presents with  . Alcohol Problem   (Consider location/radiation/quality/duration/timing/severity/associated sxs/prior Treatment) HPI This patient is a 28 year old man who presents requesting admission to a 30 day inpatient treatment program for alcoholism. The patient says he drinks approximately # 5  40 ounce beers per day. Denies drug use. Appears intoxicated and says he has had "a few" drinks PTA.   The patient says he has been through one inpatient treatment program in the past and this was a couple of years ago. The patient denies history of delirium following alcohol abstinence and denies history of withdrawal seizures.  The patient denies suicidal and homicidal ideation during my examination  Past Medical History  Diagnosis Date  . No significant past medical history    History reviewed. No pertinent past surgical history. History reviewed. No pertinent family history. History  Substance Use Topics  . Smoking status: Current Some Day Smoker  . Smokeless tobacco: Not on file  . Alcohol Use: 60.0 oz/week    120 drink(s) per week     Comment: 3-4 40's a day    Review of Systems Ten point review of symptoms performed and is negative with the exception of symptoms noted above.   Allergies  Review of patient's allergies indicates no known allergies.  Home Medications   Current Outpatient Rx  Name  Route  Sig  Dispense  Refill  . cyclobenzaprine (FLEXERIL) 10 MG tablet   Oral   Take 1 tablet (10 mg total) by mouth 3 (three) times daily as needed for muscle spasms.   10 tablet   0   . ibuprofen (ADVIL,MOTRIN) 800 MG tablet   Oral   Take 1 tablet (800 mg total) by mouth every 8 (eight) hours as needed for mild pain or moderate pain.   15 tablet   0    BP 143/102  Pulse 82  Temp(Src) 98.7 F (37.1 C) (Oral)  Resp 20   SpO2 98% Physical Exam Gen: well developed and well nourished appearing somnolent but easily arousable Head: NCAT Eyes: PERL, EOMI Nose: no epistaixis or rhinorrhea Mouth/throat: mucosa is moist and pink Neck: supple, no stridor Lungs: CTA B, no wheezing, rhonchi or rales CV: RRR, no murmur, extremities appear well perfused.  Abd: soft, notender Back: Normal to inspection Skin: warm and dry Ext: normal to inspection Neuro: CN ii-xii grossly intact, no focal deficits Psyche; flat affect,  calm and cooperative.   ED Course  Procedures (including critical care time) Labs Review  Results for orders placed during the hospital encounter of 10/12/13 (from the past 24 hour(s))  URINE RAPID DRUG SCREEN (HOSP PERFORMED)     Status: None   Collection Time    10/12/13  2:05 AM      Result Value Range   Opiates NONE DETECTED  NONE DETECTED   Cocaine NONE DETECTED  NONE DETECTED   Benzodiazepines NONE DETECTED  NONE DETECTED   Amphetamines NONE DETECTED  NONE DETECTED   Tetrahydrocannabinol NONE DETECTED  NONE DETECTED   Barbiturates NONE DETECTED  NONE DETECTED  URINALYSIS, ROUTINE W REFLEX MICROSCOPIC     Status: None   Collection Time    10/12/13  2:05 AM      Result Value Range   Color, Urine YELLOW  YELLOW   APPearance CLEAR  CLEAR   Specific Gravity,  Urine 1.005  1.005 - 1.030   pH 6.5  5.0 - 8.0   Glucose, UA NEGATIVE  NEGATIVE mg/dL   Hgb urine dipstick NEGATIVE  NEGATIVE   Bilirubin Urine NEGATIVE  NEGATIVE   Ketones, ur NEGATIVE  NEGATIVE mg/dL   Protein, ur NEGATIVE  NEGATIVE mg/dL   Urobilinogen, UA 0.2  0.0 - 1.0 mg/dL   Nitrite NEGATIVE  NEGATIVE   Leukocytes, UA NEGATIVE  NEGATIVE  CBC WITH DIFFERENTIAL     Status: Abnormal   Collection Time    10/12/13  2:07 AM      Result Value Range   WBC 4.5  4.0 - 10.5 K/uL   RBC 4.26  4.22 - 5.81 MIL/uL   Hemoglobin 14.0  13.0 - 17.0 g/dL   HCT 16.139.3  09.639.0 - 04.552.0 %   MCV 92.3  78.0 - 100.0 fL   MCH 32.9  26.0 - 34.0 pg    MCHC 35.6  30.0 - 36.0 g/dL   RDW 40.913.5  81.111.5 - 91.415.5 %   Platelets 241  150 - 400 K/uL   Neutrophils Relative % 38 (*) 43 - 77 %   Lymphocytes Relative 44  12 - 46 %   Monocytes Relative 11  3 - 12 %   Eosinophils Relative 5  0 - 5 %   Basophils Relative 2 (*) 0 - 1 %   Neutro Abs 1.7  1.7 - 7.7 K/uL   Lymphs Abs 2.0  0.7 - 4.0 K/uL   Monocytes Absolute 0.5  0.1 - 1.0 K/uL   Eosinophils Absolute 0.2  0.0 - 0.7 K/uL   Basophils Absolute 0.1  0.0 - 0.1 K/uL  COMPREHENSIVE METABOLIC PANEL     Status: Abnormal   Collection Time    10/12/13  2:07 AM      Result Value Range   Sodium 140  137 - 147 mEq/L   Potassium 3.7  3.7 - 5.3 mEq/L   Chloride 97  96 - 112 mEq/L   CO2 24  19 - 32 mEq/L   Glucose, Bld 124 (*) 70 - 99 mg/dL   BUN 6  6 - 23 mg/dL   Creatinine, Ser 7.820.76  0.50 - 1.35 mg/dL   Calcium 8.9  8.4 - 95.610.5 mg/dL   Total Protein 8.2  6.0 - 8.3 g/dL   Albumin 4.4  3.5 - 5.2 g/dL   AST 95 (*) 0 - 37 U/L   ALT 69 (*) 0 - 53 U/L   Alkaline Phosphatase 84  39 - 117 U/L   Total Bilirubin 0.3  0.3 - 1.2 mg/dL   GFR calc non Af Amer >90  >90 mL/min   GFR calc Af Amer >90  >90 mL/min  ETHANOL     Status: Abnormal   Collection Time    10/12/13  2:07 AM      Result Value Range   Alcohol, Ethyl (B) 404 (*) 0 - 11 mg/dL  MAGNESIUM     Status: None   Collection Time    10/12/13  2:07 AM      Result Value Range   Magnesium 2.4  1.5 - 2.5 mg/dL    MDM  Patient presents with alcohol intoxication request inpatient treatment for alcoholism. He has been medically cleared. TTS consult placed. If TTS is unable to place, the patient may be safely discharged with script for Ativan prn withdrawal sx and outpatient referral.  Case signed out to Dr. Fredderick PhenixBelfi who will follow up on  TTS recs.     Brandt Loosen, MD 10/12/13 [phone]
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
## 2013-10-12 NOTE — BH Assessment (Signed)
[facility] Assessment Progress Note  Spoke with Dr. Gilmore LarocheBelfie for clinicals and set up telepsych for 12:55 with Drinda ButtsAnnette, RN.

## 2013-10-12 NOTE — Progress Notes (Signed)
Pt valuables envelope received from [facility] after pt admitted. Pt checked contents of envelope and everything accounted for. Pt requests that this envelope which contains $187 and a wallet to be given to fiancee upon her arrival this evening for visitation.

## 2013-10-12 NOTE — ED Notes (Signed)
[facility]  CALLED  FOR  TRANSPORT   

## 2013-10-12 NOTE — ED Notes (Signed)
etoh abuse. Beer.  Just tired.  Suicidal thoughts in past. ? Hang self.  Not suicidal or homicidal.

## 2013-10-12 NOTE — Progress Notes (Signed)
28 year old male pt admitted voluntary basis. Pt reports he is here voluntarily to help get detoxed from ETOH. Pt reports that he was here about a year ago for the same and spoke about how he has two court dates pending for drinking and driving and realized it was time to stop. Pt reports that he lives with his fiancee and children and will return there after discharge. Pt denies any medical issues and denies that he is on any medications at this time. Pt does endorse some depression but denies any SI and is able to contract for safety on the unit. Pt was oriented to unit and safety maintained.

## 2013-10-12 NOTE — ED Notes (Signed)
Pt c/o of alcohol intoxification.  Pt states that his last drink was at 0130, He drinks 40oz beer, pt is intoxicated

## 2013-10-12 NOTE — ED Provider Notes (Signed)
[facility]  Rolan BuccoMelanie Treazure Nery, MD 10/12/13 [phone]

## 2013-10-13 ENCOUNTER — Encounter (HOSPITAL_COMMUNITY): Payer: Self-pay | Admitting: Psychiatry

## 2013-10-13 DIAGNOSIS — F1020 Alcohol dependence, uncomplicated: Secondary | ICD-10-CM

## 2013-10-13 DIAGNOSIS — F10939 Alcohol use, unspecified with withdrawal, unspecified: Principal | ICD-10-CM | POA: Diagnosis present

## 2013-10-13 DIAGNOSIS — F10239 Alcohol dependence with withdrawal, unspecified: Principal | ICD-10-CM | POA: Diagnosis present

## 2013-10-13 MED ORDER — NICOTINE POLACRILEX 2 MG MT GUM: 2.0000 mg | CHEWING_GUM | OROMUCOSAL | Status: DC | PRN

## 2013-10-13 NOTE — Progress Notes (Signed)
Recreation Therapy Notes  Date: 02.09.2015 Time: 2:45pm Location: [address] Dayroom   Group Topic: Wellness  Goal Area(s) Addresses:  Patient will identify dimension of wellness they most struggle with.  Patient will identify at least 3 ways to invest in that type of wellness.  Patient will identify benefit of identifying areas of improvement.   Behavioral Response: Appropriate, Engaged, Attentive  Intervention: Art  Activity: Patients were provided with a worksheet outlining 6 dimensions of wellness. Using this worksheet patients were asked to identify the area they most need to invest in. Using art supplies Conservation officer, historic buildings(construction paper, markers, crayons, magazine clippings, scissors, and glue) patients were asked to design a poster around the three things they are going to do to invest in their wellness.   Education: Wellness, Building control surveyorDischarge Planning.   Education Outcome: Acknowledges understanding   Clinical Observations/Feedback: Patient actively engaged in activity. Patient chose to focus on his emotional and physical wellness, successfully identifying three ways he can invest in his emotional and physical wellness. Patient contributed to group discussion, identifying importance of investment in wellness, as well as highlighting possibility for balance in his life when he invests in his wellness. Patient additionally highlighted importance of setting goals to achieve wellness and used the group activity as an example of setting goals for his wellness.    Marykay Lexenise L Chasey Dull, LRT/CTRS  Jearl KlinefelterBlanchfield, Rajah Lamba L 10/13/2013 5:31 PM

## 2013-10-13 NOTE — Progress Notes (Signed)
D: Pt presents with flat affect and depressed mood. Pt reports that he is seeking help with alcohol abuse. Pt reports mild withdrawal symptoms. Pt c/o feeling fatigued and have spent all morning in bed resting. Pt noncompliant with attending groups this morning. Pt denies SI/HI/AVH. A:  Medications administered as ordered per MD. Verbal support given. Pt encouraged to attend groups. 15 minute checks performed for safety. R: Pt safety maintained.

## 2013-10-13 NOTE — [facility] Suicide Risk Assessment (Signed)
[facility] INPATIENT: Family/Significant Other Suicide Prevention Education   Suicide Prevention Education:  Education Completed; No one has been identified by the patient as the family member/significant other with whom the patient will be residing, and identified as the person(s) who will aid the patient in the event of a mental health crisis (suicidal ideations/suicide attempt).   Pt did not c/o SI at admission, nor have they endorsed SI during their stay here. SPE not required. SPI pamphlet provided to pt and he was encouraged to share information with his support network, ask questions, and talk about any concerns.   The suicide prevention education provided includes the following:  Suicide risk factors  Suicide prevention and interventions  National Suicide Hotline telephone number  [facility] assessment telephone number  [facility] Emergency Assistance 911  [facility] and/or Residential Mobile Crisis Unit telephone number  Reyes IvanChelsea Horton, [place]CS[place] 10/13/2013  2:11 PM

## 2013-10-13 NOTE — Progress Notes (Signed)
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 Pt wants to get sober so that his drinking won't mess up this job opportunity.  Pt questioned what the success rate was for individuals who went through detox and just quit drinking once they decided to. Pt was encouraged to search out AA meetings, find a sponsor, and a support system once in [place].   Shelah LewandowskySquires, Gannon Heinzman Carol 10/13/2013, 2:59 AM

## 2013-10-13 NOTE — Progress Notes (Signed)
Adult Psychoeducational Group Note  Date:  10/13/2013 Time:  10:18 PM  Group Topic/Focus:  Wrap-Up Group:   The focus of this group is to help patients review their daily goal of treatment and discuss progress on daily workbooks.  Participation Level:  Active  Participation Quality:  Attentive  Affect:  Appropriate  Cognitive:  Alert  Insight: Good  Engagement in Group:  Engaged  Modes of Intervention:  Discussion  Additional Comments:  Patient says that he had a good day with a visitor. Says that he is happy that the doctor did not release him today and he can finish his 7 days here. Patient says he wants to move forward with his life and get a new job. Patient says for his wellness he has to stay busy working and take care of his kids.  Percell LocusJOHNSON,TAWANA 10/13/2013, 10:18 PM

## 2013-10-13 NOTE — [facility] Counselor (Signed)
Adult Psychosocial Assessment Update Interdisciplinary Team  Previous [facility] admissions/discharges:  Admissions Discharges  Date: 10/29/12 Date: 10/31/12  Date: Date:  Date: Date:  Date: Date:  Date: Date:   Changes since the last Psychosocial Assessment (including adherence to outpatient mental health and/or substance abuse treatment, situational issues contributing to decompensation and/or relapse). Pt presented to the ED for alcohol detox.  Pt states that he has been drinking 5 or 6 40 oz daily for the past couple of years.  Pt has had two recent DUIs and wants to get help for this reason. Pt states that his alcohol use is causing problems for his work, relationships and legal problems.  Pt states otherwise, his life has been going well: has a fiance, got degrees through school, children are doing well, got a house and is working.  Pt states that his legal problems are his motivation to stop drinking.  Pt did not follow up with [facility], where he was referred to on last admission.               Discharge Plan 1. Will you be returning to the same living situation after discharge?   Yes: X [place].  No:      If no, what is your plan?           2. Would you like a referral for services when you are discharged? Yes:  X   If yes, for what services? Pt is unsure if he will do inpatient and outpatient at this time.  CSW will make appropriate referrals.    No:              Summary and Recommendations (to be completed by the evaluator) Patient is a 28 year old African American male with a diagnosis of Alcohol Use Disorder and MDD, moderate.  [place].  Patient will benefit from crisis stabilization, medication evaluation, group therapy and psycho education in addition to case management for discharge planning.                         Signature:  Carmina MillerHorton, Dearius Hoffmann Nicole, 10/13/2013 10:18 AM

## 2013-10-13 NOTE — [facility] Group Notes (Signed)
[facility] Note   10/13/2013  8:45 AM  Participation Quality:  Did Not Attend - pt sleeping in his room  Tyler IvanChelsea Horton, LCSW 10/13/2013 9:37 AM

## 2013-10-13 NOTE — Progress Notes (Signed)
  D: Pt observed sleeping in bed with eyes closed. RR even and unlabored. No distress noted  .  A: Q 15 minute checks were done for safety.  R: safety maintained on unit.  

## 2013-10-13 NOTE — [facility] Group Notes (Signed)
[facility] LCSW Group Therapy  10/13/2013  1:15 PM   Type of Therapy:  Group Therapy  Participation Level:  Active  Participation Quality:  Attentive, Sharing and Supportive  Affect:  Calm  Cognitive:  Alert and Oriented  Insight:  Developing/Improving and Engaged  Engagement in Therapy:  Developing/Improving and Engaged  Modes of Intervention:  Clarification, Confrontation, Discussion, Education, Exploration, Limit-setting, Orientation, Problem-solving, Rapport Building, Dance movement psychotherapisteality Testing, Socialization and Support  Summary of Progress/Problems: Pt identified obstacles faced currently and processed barriers involved in overcoming these obstacles. Pt identified steps necessary for overcoming these obstacles and explored motivation (internal and external) for facing these difficulties head on. Pt further identified one area of concern in their lives and chose a goal to focus on for today.  Pt shared that his biggest obstacle is his alcohol use.  Pt states that he got 2 DWI's within the last month.  Pt states that things have been going well in his life but his drinking is starting to become unmanageable and affect his life negatively, such as the legal issues.  Pt states that he is now motivated to stop drinking for this reason.  Pt actively participated and was engaged in group discussion.    Tyler IvanChelsea Horton, LCSW 10/13/2013 2:20 PM

## 2013-10-13 NOTE — [facility] Suicide Risk Assessment (Signed)
Suicide Risk Assessment  Admission Assessment     Nursing information obtained from:    Demographic factors:    Current Mental Status:    Loss Factors:    Historical Factors:    Risk Reduction Factors:    Total Time spent with patient: 45 minutes  CLINICAL FACTORS:   Alcohol/Substance Abuse/Dependencies     COGNITIVE FEATURES THAT CONTRIBUTE TO RISK:  Closed-mindedness Polarized thinking Thought constriction (tunnel vision)    SUICIDE RISK:   Moderate:  Frequent suicidal ideation with limited intensity, and duration, some specificity in terms of plans, no associated intent, good self-control, limited dysphoria/symptomatology, some risk factors present, and identifiable protective factors, including available and accessible social support.  PLAN OF CARE: Supportive approach/coping skills/relapse prevention                               [facility] detox/reassess and address the co morbidities  I certify that inpatient services furnished can reasonably be expected to improve the patient's condition.  Bryelle Spiewak A 10/13/2013, 4:56 PM

## 2013-10-13 NOTE — H&P (Signed)
Psychiatric Admission Assessment Adult  Patient Identification:  Tyler Maxwell Date of Evaluation:  10/13/2013 Chief Complaint:  alcohol dependence, major depression History of Present Illness:: [name] who comes requesting help with his alcoholism. He has had two DWI  in two months. Drinks 3-4 40 ounces a day. Started drinking when he was 33. He wants to get his life back together. He has to go to court for the DWI. He also wants to be abstinent as he will be starting a new job, and does not want to mess that up. He lives with his fiancee and she is supportive but wants him to quit if they are going to be together. He as tried to quit on his own, but he experiences sweats, shakes. He is afraid to have a seizure for what he dinks firs thing in the morning Elements:  Location:  alcohol dependence. Quality:  unabel to fucntion, starts drinking in hte morning to help the withdrawal . Severity:  severe. Timing:  every day. Duration:  on going since he was 12. Context:  alcohol dependence recent DWI's . Associated Signs/Synptoms: Depression Symptoms:  depressed mood, fatigue, anxiety, loss of energy/fatigue, disturbed sleep, weight loss, decreased appetite, (Hypo) Manic Symptoms:  none Anxiety Symptoms:  Excessive Worry, Psychotic Symptoms:  none PTSD Symptoms: Negative Total Time spent with patient: 45 minutes  Psychiatric Specialty Exam: Physical Exam  Review of Systems  Constitutional: Positive for weight loss and malaise/fatigue.  HENT: Negative.   Eyes: Negative.   Respiratory: Positive for cough.        Pack a day  Gastrointestinal: Negative.   Genitourinary: Positive for dysuria.  Musculoskeletal: Negative.   Skin: Negative.   Neurological: Positive for tremors and weakness.  Endo/Heme/Allergies: Negative.   Psychiatric/Behavioral: Positive for substance abuse. The patient is nervous/anxious and has insomnia.     Blood pressure 134/91, pulse 78, temperature 97.9 F  (36.6 C), temperature source Oral, resp. rate 16, height 6' 2"  (1.88 m), weight 71.215 kg (157 lb).Body mass index is 20.15 kg/(m^2).  General Appearance: Disheveled  Eye Contact::  Minimal  Speech:  Clear and Coherent and Slow  Volume:  Decreased  Mood:  Anxious, Depressed and worried  Affect:  Restricted  Thought Process:  Coherent and Goal Directed  Orientation:  Full (Time, Place, and Person)  Thought Content:  events worries, concerns  Suicidal Thoughts:  No  Homicidal Thoughts:  No  Memory:  Immediate;   Fair Recent;   Fair Remote;   Fair  Judgement:  Fair  Insight:  Shallow  Psychomotor Activity:  Restlessness  Concentration:  Fair  Recall:  [employer] [place]: Fair  Akathisia:  No  Handed:    AIMS (if indicated):     Assets:  Desire for Improvement Housing Social Support Talents/Skills  Sleep:  Number of Hours: 6    Musculoskeletal: Strength & Muscle Tone: within normal limits Gait & Station: normal Patient leans: N/A  Past Psychiatric History: Diagnosis:  Hospitalizations: [facility]  Outpatient Care: Denies  Substance Abuse Care: Denies  Self-Mutilation:Denies  Suicidal Attempts:Denies  Violent Behaviors:Denies   Past Medical History:   Past Medical History  Diagnosis Date  . No significant past medical history   . Depression     Allergies:  No Known Allergies PTA Medications: No prescriptions prior to admission    Previous Psychotropic Medications:  Medication/Dose  None               Substance Abuse History in  the last 12 months:  yes  Consequences of Substance Abuse: Legal Consequences:  DWI  Social History:  reports that he has been smoking Cigarettes.  He has been smoking about 1.00 pack per day. He does not have any smokeless tobacco history on file. He reports that he drinks about 60.0 ounces of alcohol per week. He reports that he does not use illicit drugs. Additional Social History:                       Current Place of Residence:  Lives with fiance Place of Birth:   Family Members: Marital Status:  Single Children:  Sons: 3 and 6  Daughters: Relationships: Education:  GED, Museum/gallery curator from [employer] Problems/Performance: Religious Beliefs/Practices: History of Abuse (Emotional/Phsycial/Sexual) Occupational Experiences; Water quality scientist History:  None. Legal History: possession with intent to distribute 18  Months 2 years probation Hobbies/Interests:  Family History:  History reviewed. No pertinent family history.                             There might be family history of depresson  Results for orders placed during the hospital encounter of 10/12/13 (from the past 72 hour(s))  URINE RAPID DRUG SCREEN (HOSP PERFORMED)     Status: None   Collection Time    10/12/13  2:05 AM      Result Value Range   Opiates NONE DETECTED  NONE DETECTED   Cocaine NONE DETECTED  NONE DETECTED   Benzodiazepines NONE DETECTED  NONE DETECTED   Amphetamines NONE DETECTED  NONE DETECTED   Tetrahydrocannabinol NONE DETECTED  NONE DETECTED   Barbiturates NONE DETECTED  NONE DETECTED   Comment:            DRUG SCREEN FOR MEDICAL PURPOSES     ONLY.  IF CONFIRMATION IS NEEDED     FOR ANY PURPOSE, NOTIFY LAB     WITHIN 5 DAYS.                LOWEST DETECTABLE LIMITS     FOR URINE DRUG SCREEN     Drug Class       Cutoff (ng/mL)     Amphetamine      1000     Barbiturate      200     Benzodiazepine   176     Tricyclics       160     Opiates          300     Cocaine          300     THC              50  URINALYSIS, ROUTINE W REFLEX MICROSCOPIC     Status: None   Collection Time    10/12/13  2:05 AM      Result Value Range   Color, Urine YELLOW  YELLOW   APPearance CLEAR  CLEAR   Specific Gravity, Urine 1.005  1.005 - 1.030   pH 6.5  5.0 - 8.0   Glucose, UA NEGATIVE  NEGATIVE mg/dL   Hgb urine dipstick NEGATIVE  NEGATIVE   Bilirubin Urine NEGATIVE  NEGATIVE    Ketones, ur NEGATIVE  NEGATIVE mg/dL   Protein, ur NEGATIVE  NEGATIVE mg/dL   Urobilinogen, UA 0.2  0.0 - 1.0 mg/dL   Nitrite NEGATIVE  NEGATIVE   Leukocytes, UA NEGATIVE  NEGATIVE  Comment: MICROSCOPIC NOT DONE ON URINES WITH NEGATIVE PROTEIN, BLOOD, LEUKOCYTES, NITRITE, OR GLUCOSE <1000 mg/dL.  CBC WITH DIFFERENTIAL     Status: Abnormal   Collection Time    10/12/13  2:07 AM      Result Value Range   WBC 4.5  4.0 - 10.5 K/uL   RBC 4.26  4.22 - 5.81 MIL/uL   Hemoglobin 14.0  13.0 - 17.0 g/dL   HCT 39.3  39.0 - 52.0 %   MCV 92.3  78.0 - 100.0 fL   MCH 32.9  26.0 - 34.0 pg   MCHC 35.6  30.0 - 36.0 g/dL   RDW 13.5  11.5 - 15.5 %   Platelets 241  150 - 400 K/uL   Neutrophils Relative % 38 (*) 43 - 77 %   Lymphocytes Relative 44  12 - 46 %   Monocytes Relative 11  3 - 12 %   Eosinophils Relative 5  0 - 5 %   Basophils Relative 2 (*) 0 - 1 %   Neutro Abs 1.7  1.7 - 7.7 K/uL   Lymphs Abs 2.0  0.7 - 4.0 K/uL   Monocytes Absolute 0.5  0.1 - 1.0 K/uL   Eosinophils Absolute 0.2  0.0 - 0.7 K/uL   Basophils Absolute 0.1  0.0 - 0.1 K/uL  COMPREHENSIVE METABOLIC PANEL     Status: Abnormal   Collection Time    10/12/13  2:07 AM      Result Value Range   Sodium 140  137 - 147 mEq/L   Potassium 3.7  3.7 - 5.3 mEq/L   Chloride 97  96 - 112 mEq/L   CO2 24  19 - 32 mEq/L   Glucose, Bld 124 (*) 70 - 99 mg/dL   BUN 6  6 - 23 mg/dL   Creatinine, Ser 0.76  0.50 - 1.35 mg/dL   Calcium 8.9  8.4 - 10.5 mg/dL   Total Protein 8.2  6.0 - 8.3 g/dL   Albumin 4.4  3.5 - 5.2 g/dL   AST 95 (*) 0 - 37 U/L   ALT 69 (*) 0 - 53 U/L   Alkaline Phosphatase 84  39 - 117 U/L   Total Bilirubin 0.3  0.3 - 1.2 mg/dL   GFR calc non Af Amer >90  >90 mL/min   GFR calc Af Amer >90  >90 mL/min   Comment: (NOTE)     The eGFR has been calculated using the CKD EPI equation.     This calculation has not been validated in all clinical situations.     eGFR's persistently <90 mL/min signify possible Chronic Kidney      Disease.  ETHANOL     Status: Abnormal   Collection Time    10/12/13  2:07 AM      Result Value Range   Alcohol, Ethyl (B) 404 (*) 0 - 11 mg/dL   Comment:            LOWEST DETECTABLE LIMIT FOR     SERUM ALCOHOL IS 11 mg/dL     FOR MEDICAL PURPOSES ONLY     CRITICAL RESULT CALLED TO, READ BACK BY AND VERIFIED WITH:     BOLICK M,RN 40/98/11 9147 WAYK  MAGNESIUM     Status: None   Collection Time    10/12/13  2:07 AM      Result Value Range   Magnesium 2.4  1.5 - 2.5 mg/dL   Psychological Evaluations:  Assessment:   DSM5:  Schizophrenia Disorders:  none Obsessive-Compulsive Disorders:  none Trauma-Stressor Disorders:  none Substance/Addictive Disorders:  Alcohol Related Disorder - Severe (303.90) Depressive Disorders:  Major Depressive Disorder - Mild (296.21)  AXIS I:  Substance Induced Mood Disorder AXIS II:  Deferred AXIS III:   Past Medical History  Diagnosis Date  . No significant past medical history   . Depression    AXIS IV:  other psychosocial or environmental problems AXIS V:  51-60 moderate symptoms  Treatment Plan/Recommendations:   Supportive approach/coping skills/relapse prevention                                                                  [facility] detox protocol                                                                                   Reassess and address the co morbidities  Treatment Plan Summary: Daily contact with patient to assess and evaluate symptoms and progress in treatment Medication management Current Medications:  Current Facility-Administered Medications  Medication Dose Route Frequency Provider Last Rate Last Dose  . acetaminophen (TYLENOL) tablet 650 mg  650 mg Oral Q6H PRN Waylan Boga, NP      . alum & mag hydroxide-simeth (MAALOX/MYLANTA) 200-200-20 MG/5ML suspension 30 mL  30 mL Oral Q4H PRN Waylan Boga, NP      . chlordiazePOXIDE (LIBRIUM) capsule 25 mg  25 mg Oral Q6H PRN Waylan Boga, NP      . chlordiazePOXIDE  (LIBRIUM) capsule 25 mg  25 mg Oral QID Waylan Boga, NP   25 mg at 10/13/13 0827   Followed by  . [START ON 10/14/2013] chlordiazePOXIDE (LIBRIUM) capsule 25 mg  25 mg Oral TID Waylan Boga, NP       Followed by  . [START ON 10/15/2013] chlordiazePOXIDE (LIBRIUM) capsule 25 mg  25 mg Oral BH-qamhs Waylan Boga, NP       Followed by  . [START ON 10/16/2013] chlordiazePOXIDE (LIBRIUM) capsule 25 mg  25 mg Oral Daily Waylan Boga, NP      . hydrOXYzine (ATARAX/VISTARIL) tablet 25 mg  25 mg Oral Q6H PRN Waylan Boga, NP   25 mg at 10/12/13 2259  . loperamide (IMODIUM) capsule 2-4 mg  2-4 mg Oral PRN Waylan Boga, NP      . magnesium hydroxide (MILK OF MAGNESIA) suspension 30 mL  30 mL Oral Daily PRN Waylan Boga, NP      . multivitamin with minerals tablet 1 tablet  1 tablet Oral Daily Waylan Boga, NP   1 tablet at 10/13/13 0827  . ondansetron (ZOFRAN-ODT) disintegrating tablet 4 mg  4 mg Oral Q6H PRN Waylan Boga, NP   4 mg at 10/12/13 1620  . pneumococcal 23 valent vaccine (PNU-IMMUNE) injection 0.5 mL  0.5 mL Intramuscular Tomorrow-1000 Nicholaus Bloom, MD      . thiamine (B-1) injection 100 mg  100 mg Intramuscular Once Waylan Boga, NP      .  thiamine (VITAMIN B-1) tablet 100 mg  100 mg Oral Daily Waylan Boga, NP   100 mg at 10/13/13 0827    Observation Level/Precautions:  15 minute checks  Laboratory:  As per the ED  Psychotherapy:  Individual/group  Medications:  [facility] detox  Consultations:    Discharge Concerns:    Estimated LOS: 5-7 days  Other:     I certify that inpatient services furnished can reasonably be expected to improve the patient's condition.   Cris Gibby A 2/9/201510:37 AM

## 2013-10-13 NOTE — Tx Team (Signed)
Interdisciplinary Treatment Plan Update (Adult)  Date: 10/13/2013  Time Reviewed:  9:45 AM  Progress in Treatment: Attending groups: Yes Participating in groups:  Yes Taking medication as prescribed:  Yes Tolerating medication:  Yes Family/Significant othe contact made: CSW assessing Patient understands diagnosis:  Yes Discussing patient identified problems/goals with staff:  Yes Medical problems stabilized or resolved:  Yes Denies suicidal/homicidal ideation: Yes Issues/concerns per patient self-inventory:  Yes Other:  New problem(s) identified: N/A  Discharge Plan or Barriers: CSW assessing for appropriate referrals.    Reason for Continuation of Hospitalization: Anxiety Depression Detox Medication Stabilization  Comments: N/A  Estimated length of stay: 3-5 days  For review of initial/current patient goals, please see plan of care.  Attendees: Patient:     Family:     Physician:  Dr. Lugo 10/13/2013 9:56 AM   Nursing:   Troy Duncan, RN 10/13/2013 9:56 AM   Clinical Social Worker:  Lazaro Isenhower Horton, LCSW 10/13/2013 9:56 AM   Other: Jennifer Clark, RN case manager 10/13/2013 9:56 AM   Other:  Heather Smart, LCSWA 10/13/2013 9:56 AM   Other:  Patrice White, RN 10/13/2013 9:56 AM   Other:     Other:    Other:    Other:    Other:    Other:    Other:     Scribe for Treatment Team:   Horton, Epsie Walthall Nicole, 10/13/2013 , 9:56 AM      

## 2013-10-14 DIAGNOSIS — F10988 Alcohol use, unspecified with other alcohol-induced disorder: Secondary | ICD-10-CM

## 2013-10-14 MED ORDER — ENSURE COMPLETE PO LIQD
237.0000 mL | Freq: Three times a day (TID) | ORAL | Status: DC
  Administered 2013-10-14 – 2013-10-16 (×6): 237 mL via ORAL

## 2013-10-14 MED ORDER — NALTREXONE HCL 50 MG PO TABS
25.0000 mg | ORAL_TABLET | Freq: Every day | ORAL | Status: DC
  Administered 2013-10-15 – 2013-10-16 (×2): 25 mg via ORAL
  Filled 2013-10-14 (×4): qty 1

## 2013-10-14 NOTE — Progress Notes (Signed)
NUTRITION ASSESSMENT  Pt meets criteria for severe MALNUTRITION in the context of social/enivornmental circumstances as evidenced by <50% estimated energy intake with 10.3% weight loss in the past month.  Pt identified as at risk on the Malnutrition Screen Tool  INTERVENTION: 1. Educated patient on the importance of nutrition and encouraged intake of food and beverages. 2. Provided pt with list of healthy eating tips with emphasis on high calorie/protein foods and beverages 3. Supplements: Ensure Complete TID  NUTRITION DIAGNOSIS: Unintentional weight loss related to sub-optimal intake as evidenced by pt report.   Goal: Pt to meet >/= 90% of their estimated nutrition needs.  Monitor:  PO intake  Assessment:  [name] who comes requesting help with his alcoholism. He has had two DWI in two months. Drinks 3-4 40 ounces a day. Started drinking when he was 1. He wants to get his life back together. He has to go to court for the DWI. He also wants to be abstinent as he will be starting a new job, and does not want to mess that up.   Met with pt who reports poor appetite for the past month with barely eating anything, typically only 1 meal/day at dinner time. Reports he's lost 14 pounds during this time frame. Reports today his appetite has returned and he's eating 100% of meals. Agreeable to getting Ensure during admission. Wanted print out of healthy eating plan with foods that will help give him his strength back.   AST/ALT elevated  Getting daily multivitamin and thiamine   28 y.o. male  Height: Ht Readings from Last 1 Encounters:  10/12/13 _0  (1.88 m)    Weight: Wt Readings from Last 1 Encounters:  10/12/13 157 lb (71.215 kg)    Weight Hx: Wt Readings from Last 10 Encounters:  10/12/13 157 lb (71.215 kg)  09/07/13 175 lb (79.379 kg)  10/29/12 175 lb (79.379 kg)    BMI:  Body mass index is 20.15 kg/(m^2). Pt meets criteria for normal weight based on current  BMI.  Estimated Nutritional Needs: Kcal: 25-30 kcal/kg Protein: > 1 gram protein/kg Fluid: 1 ml/kcal  Diet Order: General Pt is also offered choice of unit snacks mid-morning and mid-afternoon.  Pt is eating as desired.   Lab results and medications reviewed.   Tyler College MS, [place], [place] Pager [phone] After Hours Pager

## 2013-10-14 NOTE — Progress Notes (Signed)
Recreation Therapy Notes  Animal-Assisted Activity/Therapy (AAA/T) Program Checklist/Progress Notes Patient Eligibility Criteria Checklist & Daily Group note for Rec Tx Intervention  Date: 02.10.2015 Time: 2:45pm Location: [address] Dayroom    AAA/T Program Assumption of Risk Form signed by Patient/ or Parent Legal Guardian yes  Patient is free of allergies or sever asthma yes  Patient reports no fear of animals yes  Patient reports no history of cruelty to animals yes   Patient understands his/her participation is voluntary yes  Behavioral Response: Did not attend  Kalyiah Saintil L Wendel Homeyer, LRT/CTRS  Vallie Fayette L 10/14/2013 4:25 PM 

## 2013-10-14 NOTE — Progress Notes (Signed)
[facility]  10/14/2013 6:12 PM Tyler Maxwell  MRN:  [id number] Subjective:  Tyler Lawmanrwin states that he had not realized how bad he was until he came here. States that he wants to get his life back together for his sake and his family. He would like some help with cravings. States that after he go to drink this time around he left everything else go. States he was not exercising, or eating properly. He became more down. One of the stressors is that with him and his fiancee and their kids also stays her sister and her kids. States it can be stressful, he takes off Diagnosis:   DSM5: Schizophrenia Disorders:  none Obsessive-Compulsive Disorders:  none Trauma-Stressor Disorders:  none Substance/Addictive Disorders:  Alcohol Related Disorder - Severe (303.90) Depressive Disorders:  Major Depressive Disorder - Moderate (296.22) Total Time spent with patient: 30 minutes  Axis I: Substance Induced Mood Disorder  ADL's:  Intact  Sleep: Fair  Appetite:  Fair  Suicidal Ideation:  Plan:  denies Intent:  denies Means:  denies Homicidal Ideation:  Plan:  denies Intent:  denies Means:  denies AEB (as evidenced by):  Psychiatric Specialty Exam: Physical Exam  Review of Systems  Constitutional: Negative.   HENT: Negative.   Eyes: Negative.   Respiratory: Negative.   Cardiovascular: Negative.   Gastrointestinal: Positive for nausea.  Genitourinary: Negative.   Musculoskeletal: Negative.   Skin: Negative.   Neurological: Negative.   Endo/Heme/Allergies: Negative.   Psychiatric/Behavioral: Positive for substance abuse. The patient is nervous/anxious.     Blood pressure 122/81, pulse 71, temperature 98 F (36.7 C), temperature source Oral, resp. rate 16, height 6\' 2"  (1.88 m), weight 71.215 kg (157 lb).Body mass index is 20.15 kg/(m^2).  General Appearance: Fairly Groomed  Patent attorneyye Contact::  Fair  Speech:  Clear and Coherent and not spontaneous  Volume:  Decreased  Mood:  Depressed   Affect:  Restricted  Thought Process:  Coherent and Goal Directed  Orientation:  Full (Time, Place, and Person)  Thought Content:  symptoms, worries, concerns  Suicidal Thoughts:  No  Homicidal Thoughts:  No  Memory:  Immediate;   Fair Recent;   Fair Remote;   Fair  Judgement:  Fair  Insight:  Shallow  Psychomotor Activity:  Restlessness  Concentration:  Fair  Recall:  [employer]  Fund of Knowledge:Fair  Language: Fair  Akathisia:  No  Handed:    AIMS (if indicated):     Assets:  Desire for Improvement Housing Social Support  Sleep:  Number of Hours: 4.5   Musculoskeletal: Strength & Muscle Tone: within normal limits Gait & Station: normal Patient leans: N/A  Current Medications: Current Facility-Administered Medications  Medication Dose Route Frequency Provider Last Rate Last Dose  . acetaminophen (TYLENOL) tablet 650 mg  650 mg Oral Q6H PRN Nanine MeansJamison Lord, NP   650 mg at 10/14/13 0810  . alum & mag hydroxide-simeth (MAALOX/MYLANTA) 200-200-20 MG/5ML suspension 30 mL  30 mL Oral Q4H PRN Nanine MeansJamison Lord, NP      . chlordiazePOXIDE (LIBRIUM) capsule 25 mg  25 mg Oral Q6H PRN Nanine MeansJamison Lord, NP      . Melene Muller[START ON 10/15/2013] chlordiazePOXIDE (LIBRIUM) capsule 25 mg  25 mg Oral BH-qamhs Nanine MeansJamison Lord, NP       Followed by  . [START ON 10/16/2013] chlordiazePOXIDE (LIBRIUM) capsule 25 mg  25 mg Oral Daily Nanine MeansJamison Lord, NP      . feeding supplement (ENSURE COMPLETE) (ENSURE COMPLETE) liquid 237 mL  237 mL  Oral TID BM Lavena Bullion, RD   237 mL at 10/14/13 1548  . hydrOXYzine (ATARAX/VISTARIL) tablet 25 mg  25 mg Oral Q6H PRN Nanine Means, NP   25 mg at 10/14/13 0011  . loperamide (IMODIUM) capsule 2-4 mg  2-4 mg Oral PRN Nanine Means, NP      . magnesium hydroxide (MILK OF MAGNESIA) suspension 30 mL  30 mL Oral Daily PRN Nanine Means, NP      . multivitamin with minerals tablet 1 tablet  1 tablet Oral Daily Nanine Means, NP   1 tablet at 10/14/13 0808  . nicotine polacrilex (NICORETTE) gum 2  mg  2 mg Oral PRN Sanjuana Kava, NP      . ondansetron (ZOFRAN-ODT) disintegrating tablet 4 mg  4 mg Oral Q6H PRN Nanine Means, NP   4 mg at 10/12/13 1620  . thiamine (B-1) injection 100 mg  100 mg Intramuscular Once Nanine Means, NP      . thiamine (VITAMIN B-1) tablet 100 mg  100 mg Oral Daily Nanine Means, NP   100 mg at 10/14/13 1610    Lab Results: No results found for this or any previous visit (from the past 48 hour(s)).  Physical Findings: AIMS: Facial and Oral Movements Muscles of Facial Expression: None, normal Lips and Perioral Area: None, normal Jaw: None, normal Tongue: None, normal,Extremity Movements Upper (arms, wrists, hands, fingers): None, normal Lower (legs, knees, ankles, toes): None, normal, Trunk Movements Neck, shoulders, hips: None, normal, Overall Severity Severity of abnormal movements (highest score from questions above): None, normal Incapacitation due to abnormal movements: None, normal Patient's awareness of abnormal movements (rate only patient's report): No Awareness, Dental Status Current problems with teeth and/or dentures?: No Does patient usually wear dentures?: No  CIWA:  CIWA-Ar Total: 0 COWS:     Treatment Plan Summary: Daily contact with patient to assess and evaluate symptoms and progress in treatment Medication management  Plan:  Supportive approach/coping skills/relapse prevention            Continue Detox            Reassess and address the co morbidities            Naltrexone 25 mg daily for cravings  Medical Decision Making Problem Points:  Review of psycho-social stressors (1) Data Points:  Review of new medications or change in dosage (2)  I certify that inpatient services furnished can reasonably be expected to improve the patient's condition.   Aimar Borghi A 10/14/2013, 6:12 PM

## 2013-10-14 NOTE — Progress Notes (Signed)
Patient ID: Tyler Maxwell, male   DOB: [birth year], 28 y.o.   MRN: [id number]  D: Patient pleasant and cooperative with staff/peers, but does endorse depression. Pt rates his depression and hopelessness at a "5" and denies SI. A: Q 15 minute safety checks, encourage staff/peer interaction and group participation. Administer medications as ordered by MD. R: Pt compliant with medications, but did not attend am group session.

## 2013-10-14 NOTE — [facility] Group Notes (Signed)
[facility] LCSW Group Therapy  10/14/2013  1:15 PM   Type of Therapy:  Group Therapy  Participation Level:  Active  Participation Quality:  Attentive, Sharing and Supportive  Affect:  Calm  Cognitive:  Alert and Oriented  Insight:  Developing/Improving and Engaged  Engagement in Therapy:  Developing/Improving and Engaged  Modes of Intervention:  Activity, Clarification, Confrontation, Discussion, Education, Exploration, Limit-setting, Orientation, Problem-solving, Rapport Building, Reality Testing, Socialization and Support  Summary of Progress/Problems: Patient was attentive and engaged with speaker from [employer].  Patient was attentive to speaker while they shared their story of dealing with mental health and overcoming it.  Patient expressed interest in their programs and services and received information on their agency.  Patient processed ways they can relate to the speaker.     Jaevion Goto Horton, LCSW 10/14/2013 2:01 PM   

## 2013-10-14 NOTE — [facility] Group Notes (Signed)
[facility] Group Notes:  (Nursing/MHT/Case Management/Adjunct)  Date:  10/14/2013  Time:  9:58 AM  Type of Therapy:  Nurse Education  Participation Level:  Did Not Attend  Participation Quality:  did not attend  Affect:  did not attend  Cognitive:  did not attend  Insight:  None  Engagement in Group:  did not attend  Modes of Intervention:  did not attend  Summary of Progress/Problems:  Renaee MundaSadler, Terrel Manalo Thomas 10/14/2013, 9:58 AM

## 2013-10-14 NOTE — Progress Notes (Signed)
D: pt is calm and cooperative. Engaging appropriately with staff and others.denies si/hi/avh. Denies having any pain. Pt had a visitor this evening which seemed to lift his spirits.  A: q15 min safety checks. Support and encouragement offered R: pt remains safe on unit. No further complaints or signs of distress

## 2013-10-15 MED ORDER — HYDROXYZINE HCL 25 MG PO TABS
25.0000 mg | ORAL_TABLET | Freq: Four times a day (QID) | ORAL | Status: DC | PRN
  Administered 2013-10-15: 25 mg via ORAL

## 2013-10-15 NOTE — Progress Notes (Signed)
[facility]  10/15/2013 5:08 PM Tyler Maxwell  MRN:  [id number] Subjective:  Tyler Maxwell states that he is ready to get on with his life. Understands that the alcohol is starting to affect his body and does not want to have this happen to him. Does not want to be one of the patients he has seen here who keep coming back. States that once he gets out there and gets busy and starts exercising again he is not going to have time for the alcohol. States his fiancee is very supportive. He wants to be healthy for her and the kids as well as for himself Diagnosis:   DSM5: Schizophrenia Disorders:  denies Obsessive-Compulsive Disorders:  denies Trauma-Stressor Disorders:  denies Substance/Addictive Disorders:  Alcohol Related Disorder - Severe (303.90) Depressive Disorders:  none Total Time spent with patient: 30 minutes  Axis I: Substance Induced Mood Disorder  ADL's:  Intact  Sleep: Fair  Appetite:  Fair  Suicidal Ideation:  Plan:  denies Intent:  denies Means:  denies Homicidal Ideation:  Plan:  denies Intent:  denies Means:  denies AEB (as evidenced by):  Psychiatric Specialty Exam: Physical Exam  Review of Systems  Constitutional: Negative.   HENT: Negative.   Eyes: Negative.   Respiratory: Negative.   Cardiovascular: Negative.   Gastrointestinal: Negative.   Genitourinary: Negative.   Musculoskeletal: Negative.   Skin: Negative.   Neurological: Negative.   Endo/Heme/Allergies: Negative.   Psychiatric/Behavioral: Positive for substance abuse. The patient is nervous/anxious.     Blood pressure 133/87, pulse 89, temperature 97.3 F (36.3 C), temperature source Oral, resp. rate 18, height 6\' 2"  (1.88 m), weight 71.215 kg (157 lb).Body mass index is 20.15 kg/(m^2).  General Appearance: Fairly Groomed  Patent attorneyye Contact::  Fair  Speech:  Clear and Coherent  Volume:  Normal  Mood:  Anxious and worried  Affect:  worried  Thought Process:  Coherent and Goal Directed   Orientation:  Full (Time, Place, and Person)  Thought Content:  worries, concerns  Suicidal Thoughts:  No  Homicidal Thoughts:  No  Memory:  Immediate;   Fair Recent;   Fair Remote;   Fair  Judgement:  Fair  Insight:  Present  Psychomotor Activity:  Restlessness  Concentration:  Fair  Recall:  FiservFair  Fund of Knowledge:Fair  Language: Fair  Akathisia:  No  Handed:    AIMS (if indicated):     Assets:  Desire for Improvement  Sleep:  Number of Hours: 6.5   Musculoskeletal: Strength & Muscle Tone: within normal limits Gait & Station: normal Patient leans: N/A  Current Medications: Current Facility-Administered Medications  Medication Dose Route Frequency Provider Last Rate Last Dose  . acetaminophen (TYLENOL) tablet 650 mg  650 mg Oral Q6H PRN Nanine MeansJamison Lord, NP   650 mg at 10/15/13 0657  . alum & mag hydroxide-simeth (MAALOX/MYLANTA) 200-200-20 MG/5ML suspension 30 mL  30 mL Oral Q4H PRN Nanine MeansJamison Lord, NP      . chlordiazePOXIDE (LIBRIUM) capsule 25 mg  25 mg Oral BH-qamhs Nanine MeansJamison Lord, NP   25 mg at 10/15/13 0806   Followed by  . [START ON 10/16/2013] chlordiazePOXIDE (LIBRIUM) capsule 25 mg  25 mg Oral Daily Nanine MeansJamison Lord, NP      . feeding supplement (ENSURE COMPLETE) (ENSURE COMPLETE) liquid 237 mL  237 mL Oral TID BM Lavena BullionHeather W Baron, RD   237 mL at 10/15/13 1445  . hydrOXYzine (ATARAX/VISTARIL) tablet 25 mg  25 mg Oral Q6H PRN Beau FannyJohn C Withrow, FNP      .  magnesium hydroxide (MILK OF MAGNESIA) suspension 30 mL  30 mL Oral Daily PRN Nanine Means, NP      . multivitamin with minerals tablet 1 tablet  1 tablet Oral Daily Nanine Means, NP   1 tablet at 10/15/13 1610  . naltrexone (DEPADE) tablet 25 mg  25 mg Oral Daily Rachael Fee, MD   25 mg at 10/15/13 0806  . nicotine polacrilex (NICORETTE) gum 2 mg  2 mg Oral PRN Sanjuana Kava, NP      . thiamine (B-1) injection 100 mg  100 mg Intramuscular Once Nanine Means, NP      . thiamine (VITAMIN B-1) tablet 100 mg  100 mg Oral Daily  Nanine Means, NP   100 mg at 10/15/13 9604    Lab Results: No results found for this or any previous visit (from the past 48 hour(s)).  Physical Findings: AIMS: Facial and Oral Movements Muscles of Facial Expression: None, normal Lips and Perioral Area: None, normal Jaw: None, normal Tongue: None, normal,Extremity Movements Upper (arms, wrists, hands, fingers): None, normal Lower (legs, knees, ankles, toes): None, normal, Trunk Movements Neck, shoulders, hips: None, normal, Overall Severity Severity of abnormal movements (highest score from questions above): None, normal Incapacitation due to abnormal movements: None, normal Patient's awareness of abnormal movements (rate only patient's report): No Awareness, Dental Status Current problems with teeth and/or dentures?: No Does patient usually wear dentures?: No  CIWA:  CIWA-Ar Total: 0 COWS:     Treatment Plan Summary: Daily contact with patient to assess and evaluate symptoms and progress in treatment Medication management  Plan: Supportive approach/coping skills/relapse prevention           Complete detox           Reassess and address the co morbidities  Medical Decision Making Problem Points:  Review of psycho-social stressors (1) Data Points:  Review of medication regiment & side effects (2) Review of new medications or change in dosage (2)  I certify that inpatient services furnished can reasonably be expected to improve the patient's condition.   Sai Moura A 10/15/2013, 5:08 PM

## 2013-10-15 NOTE — Tx Team (Signed)
Interdisciplinary Treatment Plan Update (Adult)  Date: 10/15/2013  Time Reviewed:  9:45 AM  Progress in Treatment: Attending groups: Yes Participating in groups:  Yes Taking medication as prescribed:  Yes Tolerating medication:  Yes Family/Significant othe contact made: No, N/A Patient understands diagnosis:  Yes Discussing patient identified problems/goals with staff:  Yes Medical problems stabilized or resolved:  Yes Denies suicidal/homicidal ideation: Yes Issues/concerns per patient self-inventory:  Yes Other:  New problem(s) identified: N/A  Discharge Plan or Barriers: Pt has [facility] medication management and therapy and [facility] for inpatient treatment.    Reason for Continuation of Hospitalization: Anxiety Depression Medication Stabilization  Comments: N/A  Estimated length of stay: 1 day, d/c tomorrow  For review of initial/current patient goals, please see plan of care.  Attendees: Patient:     Family:     Physician:  Dr. Dub MikesLugo 10/15/2013 10:23 AM   Nursing:   Robbie LouisVivian Kent, RN 10/15/2013 10:23 AM   Clinical Social Worker:  Reyes Ivanhelsea Horton, LCSW 10/15/2013 10:23 AM   Other: Onnie BoerJennifer Clark, RN case manager 10/15/2013 10:23 AM   Other:  Trula SladeHeather Smart, LCSWA 10/15/2013 10:23 AM   Other:  Serena ColonelAggie Nwoko, NP 10/15/2013 10:23 AM   Other:  Roswell Minersonna Shimp, RN 10/15/2013 10:23 AM   Other: Quintella ReichertBeverly Knight, RN 10/15/2013 10:23 AM   Other:    Other:    Other:    Other:    Other:     Scribe for Treatment Team:   Carmina MillerHorton, Greogry Goodwyn Nicole, 10/15/2013 , 10:23 AM

## 2013-10-15 NOTE — Progress Notes (Signed)
D: Pt presents anxious this morning, somewhat fidgety. Pt reports depression 2/10 and hopeless 2/10. Pt denies having any withdrawal symptoms this morning but c/o left arm pain. Scheduled pain med given for pain. Pt irritable this morning during group with pharmacist. Pt left out of group twice, stating  he do not need to listen about meds because he already know what he is taking. According to the pt, once he is discharged, he plans to attend outpatient treatment. A: Medications administered as ordered per MD. Verbal support given. Pt encouraged to attend groups. 15 minute checks performed for safety. R: Pt safety maintained.

## 2013-10-15 NOTE — [facility] Group Notes (Signed)
[facility]   10/15/2013 8:45 AM  Participation Quality:  Alert, Appropriate and Oriented  Mood/Affect:  Drowsy  Depression Rating:  2  Anxiety Rating:  2  Thoughts of Suicide:  Pt denies SI/HI  Will you contract for safety?   Yes  Current AVH:  Pt denies  Plan for Discharge/Comments:  Pt attended discharge planning group and actively participated in group.  CSW provided pt with today's workbook.  Pt states that he feels drowsy today but is ready to d/c soon.  [place] and has follow [facility] scheduled [facility] and [facility].  Pt also wants [facility] as a back up option for inpatient treatment when he returns home, if needed.  No further needs voiced by pt at this time.    Transportation Means: Pt reports access to transportation - friend will pick pt up  Supports: No supports mentioned at this time  Reyes IvanChelsea Horton, LCSW 10/15/2013 10:02 AM

## 2013-10-15 NOTE — Progress Notes (Signed)
Patient ID: Tyler Maxwell, male   DOB: October 20, 1985, 28 y.o.   MRN: [id number]  D: Pt was pleasant during the assessment. Pt informed the Clinical research associatewriter that he plans to discharge [facility]. Stated he's never going to drink again. "I lost 24 lbs drinking. I promise, yall will never see me again."  A:  Support and encouragement was offered. 15 min checks continued for safety.  R: Pt remains safe.

## 2013-10-15 NOTE — B[facility] Group Notes (Signed)
[facility] LCSW Group Therapy  10/15/2013  1:15 PM   Type of Therapy:  Group Therapy  Participation Level:  Active  Participation Quality:  Attentive, Sharing and Supportive  Affect:  Drowsy  Cognitive:  Alert and Oriented  Insight:  Developing/Improving and Engaged  Engagement in Therapy:  Developing/Improving and Engaged  Modes of Intervention:  Clarification, Confrontation, Discussion, Education, Exploration, Limit-setting, Orientation, Problem-solving, Rapport Building, Dance movement psychotherapisteality Testing, Socialization and Support  Summary of Progress/Problems: The topic for group today was emotional regulation.  This group focused on both positive and negative emotion identification and allowed group members to process ways to identify feelings, regulate negative emotions, and find healthy ways to manage internal/external emotions. Group members were asked to reflect on a time when their reaction to an emotion led to a negative outcome and explored how alternative responses using emotion regulation would have benefited them. Group members were also asked to discuss a time when emotion regulation was utilized when a negative emotion was experienced.  Pt shared that he deals with anger but has learned to walk away and count to 10, which he reports works well for him.  Pt was observed to be sleepy through out group but attempted to participate and engage in group discussion.    Tyler IvanChelsea Horton, LCSW 10/15/2013 2:03 PM

## 2013-10-15 NOTE — Progress Notes (Signed)
Adult Psychoeducational Group Note  Date:  10/15/2013 Time:  11:09 PM  Group Topic/Focus:  NA group  Participation Level:  Active  Participation Quality:  Appropriate  Affect:  Appropriate  Cognitive:  Alert  Insight: Appropriate  Engagement in Group:  Engaged  Modes of Intervention:  Discussion  Additional Comments:    Flonnie HailstoneCOOKE, Wilver Tignor R 10/15/2013, 11:09 PM

## 2013-10-16 MED ORDER — HYDROXYZINE HCL 25 MG PO TABS: ORAL_TABLET | ORAL | Status: DC

## 2013-10-16 MED ORDER — HYDROXYZINE HCL 25 MG PO TABS
25.0000 mg | ORAL_TABLET | Freq: Three times a day (TID) | ORAL | Status: DC
  Filled 2013-10-16 (×3): qty 42

## 2013-10-16 MED ORDER — NALTREXONE HCL 50 MG PO TABS: 25.0000 mg | ORAL_TABLET | Freq: Every day | ORAL | Status: DC

## 2013-10-16 NOTE — B[facility] Suicide Risk Assessment (Signed)
Suicide Risk Assessment  Discharge Assessment     Demographic Factors:  Male  Total Time spent with patient: 45 minutes  Psychiatric Specialty Exam:     Blood pressure 115/77, pulse 85, temperature 97.3 F (36.3 C), temperature source Oral, resp. rate 16, height 6\' 2"  (1.88 m), weight 71.215 kg (157 lb).Body mass index is 20.15 kg/(m^2).  General Appearance: Fairly Groomed  Patent attorneyye Contact::  Fair  Speech:  Clear and Coherent  Volume:  Decreased  Mood:  Euthymic  Affect:  Appropriate  Thought Process:  Coherent and Goal Directed  Orientation:  Full (Time, Place, and Person)  Thought Content:  relapse prevention plan: [facility]  Suicidal Thoughts:  No  Homicidal Thoughts:  No  Memory:  Immediate;   Fair Recent;   Fair Remote;   Fair  Judgement:  Fair  Insight:  Present  Psychomotor Activity:  Normal  Concentration:  Fair  Recall:  FiservFair  Fund of Knowledge:Fair  Language: Fair  Akathisia:  No  Handed:    AIMS (if indicated):     Assets:  Desire for Improvement  Sleep:  Number of Hours: 6.75    Musculoskeletal: Strength & Muscle Tone: within normal limits Gait & Station: normal Patient leans: N/A   Mental Status Per Nursing Assessment::   On Admission:     Current Mental Status by Physician: in full contact with reality. No active S/S of withdrawal   Loss Factors: NA  Historical Factors: NA  Risk Reduction Factors:   Responsible for children under 30 years of age, Employed, Living with another person, especially a relative and Positive social support  Continued Clinical Symptoms:  Alcohol/Substance Abuse/Dependencies  Cognitive Features That Contribute To Risk:  Closed-mindedness Polarized thinking Thought constriction (tunnel vision)    Suicide Risk:  Minimal: No identifiable suicidal ideation.  Patients presenting with no risk factors but with morbid ruminations; may be classified as minimal risk based on the severity of the  depressive symptoms  Discharge Diagnoses:   AXIS I:  Alcohol Dependence, Substance Induced Mood Disorder AXIS II:  Deferred AXIS III:   Past Medical History  Diagnosis Date  . No significant past medical history   . Depression    AXIS IV:  other psychosocial or environmental problems AXIS V:  61-70 mild symptoms  Plan Of Care/Follow-up recommendations:  Activity:  as tolerated Diet:  regular Follow up [facility] Is patient on multiple antipsychotic therapies at discharge:  No   Has Patient had three or more failed trials of antipsychotic monotherapy by history:  No  Recommended Plan for Multiple Antipsychotic Therapies: NA    Renley Gutman A 10/16/2013, 11:35 AM

## 2013-10-16 NOTE — Progress Notes (Signed)
Pt is excited about being discharged today. He stated he will leave around 12noon and will go home with his fiancee. He does not feel depressed or hopeless this am and does appear in good spirits. Pt denies SI and HI and contracts for safety.

## 2013-10-16 NOTE — Progress Notes (Signed)
Recreation Therapy Notes  Date: 02.11.2015 Time: 2:45pm Location: [address]   Group Topic: Anger Management  Goal Area(s) Addresses:  Patient will identify body's physical reaction to anger.  Patient will identify positive coping mechanisms to deal with anger.  Patient will select one coping mechanism of choice to use post d/c when experiencing anger.   Behavioral Response: Did not attend.    Dayle Mcnerney L Nyari Olsson, LRT/CTRS   Francisco Eyerly L 10/16/2013 9:10 AM 

## 2013-10-16 NOTE — Progress Notes (Signed)
[facility] :  Will you be returning to the same living situation after discharge: Yes,  returning home At discharge, do you have transportation home?:Yes,  friend will pick pt up Do you have the ability to pay for your medications:Yes,  provided pt with samples and prescriptions.  [facility].  Release of information consent forms completed and in the chart;  Patient's signature needed at discharge.  Patient to Follow up at: Follow-up Information   Follow up with [facility] On 10/20/2013. (Walk in on this date for hospital discharge appointment. Walk in clinic is Monday - Friday 8 am - 3 pm. They will than schedule you for medication management and therapy. )    Contact information:   [address] [place], [place] [address] Phone: [phone] Fax: [phone]      Follow up with [facility] On 10/22/2013. (Arrive at 8:00 am promptly for intake assessment, possible admission if you meet criteria.  Bring belongings, photo ID and 30 day supply of meds. Date given by Trey PaulaJeff. )    Contact information:   [address] [place], [place] [address] Phone: [phone] Fax: [phone]      Patient denies SI/HI:   Yes,  denies SI/HI    Safety Planning and Suicide Prevention discussed:  Yes,  discussed with pt.  N/A to contact friend/family due to no SI on admission.  See suicide prevention education note.   Carmina MillerHorton, Dhanvin Szeto Nicole 10/16/2013, 10:27 AM

## 2013-10-16 NOTE — BHH Group Notes (Signed)
[facility] Group Notes:  (Nursing/MHT/Case Management/Adjunct)  Date:  10/16/2013  Time:  10:21 AM  Type of Therapy:  Nurse Education  Participation Level:  Did Not Attend  Participation Quality:  Inattentive  Affect:  Flat  Cognitive:  Lacking  Insight:  None  Engagement in Group:  None  Modes of Intervention:  Discussion  Summary of Progress/Problems:pt did not attend group  Rodman KeyWebb, Larisha Vencill Tirr [facility] 10/16/2013, 10:21 AM

## 2013-10-16 NOTE — Progress Notes (Signed)
Patient ID: Tyler Maxwell, male   DOB: Aug 13, 1986, [age] y.o.   MRN: [id number] D: Took over patient's care @ 0000. Patient in bed sleeping. Respiration regular and unlabored. No sign of distress noted at this time A: 15 mins checks for safety. R: Patient is asleep. Pt is safe.

## 2013-10-16 NOTE — Discharge Summary (Signed)
Physician Discharge Summary Note  Patient:  Tyler Maxwell is an 28 y.o., male MRN:  [id number] DOB:  July 20, 1986 Patient phone:  [phone] (home)  Patient address:   [address] [place] [address],  Total Time spent with patient: Greater than 30 minutes  Date of Admission:  10/12/2013  Date of Discharge: 10/16/13  Reason for Admission:  Alcohol detox  Discharge Diagnoses: Active Problems:   Alcohol dependence   Alcohol withdrawal   Psychiatric Specialty Exam: Physical Exam  Constitutional: He is oriented to person, place, and time. He appears well-developed.  HENT:  Head: Normocephalic.  Eyes: Pupils are equal, round, and reactive to light.  Neck: Normal range of motion.  Cardiovascular: Normal rate.   Respiratory: Effort normal.  GI: Soft.  Genitourinary:  Denies any problems in this area  Musculoskeletal: Normal range of motion.  Neurological: He is alert and oriented to person, place, and time.  Skin: Skin is warm and dry.  Psychiatric: His speech is normal and behavior is normal. Judgment and thought content normal. His mood appears not anxious. His affect is not angry, not blunt, not labile and not inappropriate. Cognition and memory are normal. He does not exhibit a depressed mood.    Review of Systems  Constitutional: Negative.   HENT: Negative.   Eyes: Negative.   Respiratory: Negative.   Cardiovascular: Negative.   Gastrointestinal: Negative.   Genitourinary: Negative.   Musculoskeletal: Negative.   Skin: Negative.   Neurological: Negative.   Endo/Heme/Allergies: Negative.   Psychiatric/Behavioral: Positive for substance abuse (Alcoholism, Hx). Negative for depression, suicidal ideas, hallucinations and memory loss. The patient is not nervous/anxious and does not have insomnia.     Blood pressure 115/77, pulse 85, temperature 97.3 F (36.3 C), temperature source Oral, resp. rate 16, height 6\' 2"  (1.88 m), weight 71.215 kg (157 lb).Body mass index  is 20.15 kg/(m^2).  General Appearance: Casual and Fairly Groomed  Patent attorneyye Contact::  Good  Speech:  Clear and Coherent  Volume:  Normal  Mood:  Euthymic  Affect:  Appropriate and Congruent  Thought Process:  Coherent and Intact  Orientation:  Full (Time, Place, and Person)  Thought Content:  Denies any psychotic symptoms  Suicidal Thoughts:  No  Homicidal Thoughts:  No  Memory:  Immediate;   Good Recent;   Good Remote;   Good  Judgement:  Good  Insight:  Present  Psychomotor Activity:  Normal  Concentration:  Good  Recall:  Good  Fund of Knowledge:Good  Language: Good  Akathisia:  No  Handed:  Right  AIMS (if indicated):     Assets:  Desire for Improvement  Sleep:  Number of Hours: 6.75    Past Psychiatric History: Diagnosis: Alcohol Related Disorder - Severe (303.90),  [facility]  Outpatient Care: [facility]  Substance Abuse Care: [facility] starting on 10/22/13  Self-Mutilation: Denies  Suicidal Attempts: Denies  Violent Behaviors: Denies   Musculoskeletal: Strength & Muscle Tone: within normal limits Gait & Station: normal Patient leans: N/A  DSM5: Schizophrenia Disorders:  NA Obsessive-Compulsive Disorders:  NA Trauma-Stressor Disorders:  NA Substance/Addictive Disorders:  Alcohol Related Disorder - Severe (303.90) Depressive Disorders:  NA  Axis Diagnosis:  AXIS I:  Alcohol Related Disorder - Severe (303.90) AXIS II:  Deferred AXIS III:   Past Medical History  Diagnosis Date  . No significant past medical history   . Depression    AXIS IV:  Alcoholism, chronic AXIS V:  63  [facility]  [facility]  [facility]ospital  Course:  [name] who comes requesting help with his alcoholism. He has had two DWI in two months. Drinks 3-4 40 ounces a day. Started drinking when he was 12. He wants to get his life back together. He has to go to court for the DWI. He also wants to be abstinent as he will be starting a new job, and does not want to mess that  up. He lives with his fiancee and she is supportive but wants him to quit if they are going to be together. He as tried to quit on his own, but he experiences sweats, shakes. He is afraid to have a seizure for what he dinks firs thing in the morning.  Tyler Maxwell was admitted to the ED with blood alcohol level of 404. He was apparently intoxicated needing detoxification treatment protocols. Tyler Maxwell did not report any signs and symptoms of depression, rather the frustrations associated with the consequence of his alcoholism. He required detox treatment as well further substance abuse treatment after detox. He was ordered and received Librium detoxification treatment protocols. He was also ordered/received and is being discharged on Naltexone 25 mg daily for alcoholism. Tyler Maxwell was enrolled and participated in the group sessions and AA/NA meetings being offered and held on this unit.  Tyler Maxwell has completed detox treatment. He is currently reporting no more alcohol withdrawal symptoms. He is currently being discharged to follow-up care at the [facility] psychiatric clinic here in [place], [place] for medication management and routine psychiatric care. He has till 10/22/13 to start further substance abuse treatment at the [facility]. He was provided with 14 days worth supply samples of his [facility] discharge medications and all the pertinent information to make his appointment. [name] [facility] with all personal belongings in no apparent distress. Transportation per friend  Consults:  psychiatry  Significant Diagnostic Studies:  labs: Current lab reports reviewed, no changes.  Discharge Vitals:   Blood pressure 115/77, pulse 85, temperature 97.3 F (36.3 C), temperature source Oral, resp. rate 16, height 6\' 2"  (1.88 m), weight 71.215 kg (157 lb). Body mass index is 20.15 kg/(m^2). Lab Results:   No results found for this or any previous visit (from the past 72 hour(s)).  Physical Findings: AIMS: Facial and  Oral Movements Muscles of Facial Expression: None, normal Lips and Perioral Area: None, normal Jaw: None, normal Tongue: None, normal,Extremity Movements Upper (arms, wrists, hands, fingers): None, normal Lower (legs, knees, ankles, toes): None, normal, Trunk Movements Neck, shoulders, hips: None, normal, Overall Severity Severity of abnormal movements (highest score from questions above): None, normal Incapacitation due to abnormal movements: None, normal Patient's awareness of abnormal movements (rate only patient's report): No Awareness, Dental Status Current problems with teeth and/or dentures?: No Does patient usually wear dentures?: No  CIWA:  CIWA-Ar Total: 0 COWS:     Psychiatric Specialty Exam: See Psychiatric Specialty Exam and Suicide Risk Assessment completed by Attending Physician prior to discharge.  Discharge destination:  Other:  Home, then to [facility] on 10/22/13  Is patient on multiple antipsychotic therapies at discharge:  No   Has Patient had three or more failed trials of antipsychotic monotherapy by history:  No  Recommended Plan for Multiple Antipsychotic Therapies: NA     Medication List       Indication   hydrOXYzine 25 MG tablet  Commonly known as:  ATARAX/VISTARIL  Take 1 tablet (25 mg) three times daily for anxiousness   Indication:  Anxiety associated with Organic Disease, Tension  naltrexone 50 MG tablet  Commonly known as:  DEPADE  Take 0.5 tablets (25 mg total) by mouth daily. For alcoholism   Indication:  Excessive Use of Alcohol       Follow-up Information   Follow up with [facility] On 10/20/2013. (Walk in on this date for hospital discharge appointment. Walk in clinic is Monday - Friday 8 am - 3 pm. They will than schedule you for medication management and therapy. )    Contact information:   [address], [place] [address] Phone: [phone] Fax: [phone]      Follow up with [facility] On  10/22/2013. (Arrive at 8:00 am promptly for intake assessment, possible admission if you meet criteria.  Bring belongings, photo ID and 30 day supply of meds. Date given by Trey Paula. )    Contact information:   [address] [place], [place] [address] Phone: [phone] Fax: [phone]     Follow-up recommendations:  Activity:  As tolerated Diet: As recommended by your primary care doctor. Keep all scheduled follow-up appointments as recommended. Continue to work your relapse prevention plan Comments:  Take all your medications as prescribed by your mental healthcare provider. Report any adverse effects and or reactions from your medicines to your outpatient provider promptly. Patient is instructed and cautioned to not engage in alcohol and or illegal drug use while on prescription medicines. In the event of worsening symptoms, patient is instructed to call the crisis hotline, 911 and or go to the nearest ED for appropriate evaluation and treatment of symptoms. Follow-up with your primary care provider for your other medical issues, concerns and or health care needs.   Total Discharge Time:  Greater than 30 minutes.  Signed: Sanjuana Kava, PMHNP-BC 10/16/2013, 2:16 PM Agree with assessment and plan Madie Reno A. Dub Mikes, M.D.

## 2013-10-20 NOTE — Progress Notes (Signed)
Patient Discharge Instructions:  After Visit Summary (AVS):   Faxed to:  10/20/13 Discharge Summary Note:   Faxed to:  10/20/13 Psychiatric Admission Assessment Note:   Faxed to:  10/20/13 Suicide Risk Assessment - Discharge Assessment:   Faxed to:  10/20/13 Faxed/Sent to the Next Level Care provider:  10/20/13 [facility] @ [phone] [facility] @ [phone]  Jerelene ReddenSheena [address], 10/20/2013, 4:28 PM

## 2015-07-08 ENCOUNTER — Encounter (HOSPITAL_COMMUNITY): Payer: Self-pay | Admitting: Emergency Medicine

## 2015-07-08 ENCOUNTER — Emergency Department (HOSPITAL_COMMUNITY)
Admission: EM | Admit: 2015-07-08 | Discharge: 2015-07-08 | Disposition: A | Discharge status: Home / Self Care | Payer: Self-pay | Attending: Emergency Medicine | Admitting: Emergency Medicine

## 2015-07-08 DIAGNOSIS — K0381 Cracked tooth: Secondary | ICD-10-CM | POA: Insufficient documentation

## 2015-07-08 DIAGNOSIS — K0889 Other specified disorders of teeth and supporting structures: Secondary | ICD-10-CM | POA: Insufficient documentation

## 2015-07-08 DIAGNOSIS — Z79899 Other long term (current) drug therapy: Secondary | ICD-10-CM | POA: Insufficient documentation

## 2015-07-08 DIAGNOSIS — K029 Dental caries, unspecified: Secondary | ICD-10-CM | POA: Insufficient documentation

## 2015-07-08 DIAGNOSIS — Z720 Tobacco use: Secondary | ICD-10-CM | POA: Insufficient documentation

## 2015-07-08 MED ORDER — NAPROXEN 250 MG PO TABS: 250.0000 mg | ORAL_TABLET | Freq: Two times a day (BID) | ORAL | Status: DC

## 2015-07-08 MED ORDER — PENICILLIN V POTASSIUM 500 MG PO TABS
500.0000 mg | ORAL_TABLET | Freq: Four times a day (QID) | ORAL | Status: DC
Start: 2015-07-08 — End: 2017-03-24

## 2015-07-08 NOTE — ED Notes (Signed)
PA at bedside.

## 2015-07-08 NOTE — ED Provider Notes (Signed)
CSN: [id number]     Arrival date & time 07/08/15  2245 History  By signing my name below, I, Netta Corrigan, attest that this documentation has been prepared under the direction and in the presence of Will Newell Wafer, PA-C.  Electronically Signed: Netta Corrigan, ED Scribe. 07/08/2015. 11:31 PM.    Chief Complaint  Patient presents with  . Dental Pain   The history is provided by the patient. No language interpreter was used.   HPI Comments: Tyler Maxwell is a 29 y.o. male who presents to the Emergency Department complaining of a constant, 8/10, right upper dental pain. Pt has tried [facility] Powder with mild relief.  Pt denies any oral discharge, sore throat, dysphagia, fever, neck pain/stiffness, abdominal pain, nausea, vomiting, ear pain, ear discharge.  Pt does not have a regular dentist provider.   Past Medical History  Diagnosis Date  . No significant past medical history   . Depression    History reviewed. No pertinent past surgical history. No family history on file. Social History  Substance Use Topics  . Smoking status: Current Some Day Smoker -- 1.00 packs/day    Types: Cigarettes  . Smokeless tobacco: None  . Alcohol Use: 60.0 oz/week    120 drink(s) per week     Comment: 3-4 40's a day    Review of Systems  Constitutional: Negative for fever and chills.  HENT: Positive for dental problem. Negative for drooling, ear discharge, ear pain, facial swelling, mouth sores, sore throat and trouble swallowing.   Eyes: Negative for visual disturbance.  Gastrointestinal: Negative for nausea, vomiting and abdominal pain.  Musculoskeletal: Negative for neck pain and neck stiffness.  Skin: Negative for rash.      Allergies  Review of patient's allergies indicates no known allergies.  Home Medications   Prior to Admission medications   Medication Sig Start Date End Date Taking? Authorizing Provider  hydrOXYzine (ATARAX/VISTARIL) 25 MG tablet Take 1 tablet (25 mg) three times daily  for anxiousness 10/16/13   Sanjuana Kava, NP  naltrexone (DEPADE) 50 MG tablet Take 0.5 tablets (25 mg total) by mouth daily. For alcoholism 10/16/13   Sanjuana Kava, NP  naproxen (NAPROSYN) 250 MG tablet Take 1 tablet (250 mg total) by mouth 2 (two) times daily with a meal. 07/08/15   Everlene Farrier, PA-C  penicillin v potassium (VEETID) 500 MG tablet Take 1 tablet (500 mg total) by mouth 4 (four) times daily. 07/08/15   Everlene Farrier, PA-C   BP 134/85 mmHg  Pulse 58  Temp(Src) 98 F (36.7 C) (Oral)  Resp 16  Ht  (1.93 m)  Wt 170 lb (77.111 kg)  BMI 20.70 kg/m2  SpO2 99% Physical Exam  Constitutional: He is oriented to person, place, and time. He appears well-developed and well-nourished. No distress.  Non-toxic appearing.   HENT:  Head: Normocephalic and atraumatic.  Right Ear: External ear normal.  Left Ear: External ear normal.  Mouth/Throat: Oropharynx is clear and moist. No oropharyngeal exudate.  Tenderness to right upper molar. Multiple cracked teeth. Multiple dental caries. Generally poor dentition. No discharge from the mouth. No facial swelling.  Uvula is midline without edema. Soft palate rises symmetrically. No tonsillar hypertrophy or exudates. Tongue protrusion is normal. No trismus. Bilateral tympanic membranes are pearly-gray without erythema or loss of landmarks.    Eyes: Conjunctivae and EOM are normal. Pupils are equal, round, and reactive to light. Right eye exhibits no discharge. Left eye exhibits no discharge.  Neck: Normal range  of motion. Neck supple. No JVD present. No tracheal deviation present.  Cardiovascular: Normal rate, regular rhythm, normal heart sounds and intact distal pulses.   Pulmonary/Chest: Effort normal and breath sounds normal. No respiratory distress. He has no wheezes. He has no rales.  Abdominal: Soft. There is no tenderness. There is no guarding.  Lymphadenopathy:    He has no cervical adenopathy.  Neurological: He is alert and  oriented to person, place, and time. No cranial nerve deficit. Coordination normal.  Skin: Skin is warm and dry. No rash noted. He is not diaphoretic. No erythema. No pallor.  Psychiatric: He has a normal mood and affect. His behavior is normal.  Nursing note and vitals reviewed.   ED Course  Procedures DIAGNOSTIC STUDIES: Oxygen Saturation is 99% on RA, normal by my interpretation.    11:31 PM COORDINATION OF CARE: Discussed treatment plan with pt. Pt agreed to plan.  Labs Reviewed - No data to display No results found.  EKG Interpretation None      Filed Vitals:   07/08/15 2250  BP: 134/85  Pulse: 58  Temp: 98 F (36.7 C)  TempSrc: Oral  Resp: 16  Height: 6\' 4"  (1.93 m)  Weight: 170 lb (77.111 kg)  SpO2: 99%     MDM   Meds given in ED:  Medications - No data to display  Discharge Medication List as of 07/08/2015 11:28 PM    START taking these medications   Details  naproxen (NAPROSYN) 250 MG tablet Take 1 tablet (250 mg total) by mouth 2 (two) times daily with a meal., Starting 07/08/2015, Until Discontinued, Print    penicillin v potassium (VEETID) 500 MG tablet Take 1 tablet (500 mg total) by mouth 4 (four) times daily., Starting 07/08/2015, Until Discontinued, Print         Final diagnoses:  Pain, dental   Patient complaining of right upper dental pain for 2 days. Patient has tenderness to his right upper molar. Generally poor dentition. Multiple cracked teeth.  No gross abscess.  Exam unconcerning for Ludwig's angina or spread of infection.  Will treat with penicillin and pain medicine.  Urged patient to follow-up with dentist.  Patient provided with dental resources. I advised the patient to follow-up with their primary care provider this week. I advised the patient to return to the emergency department with new or worsening symptoms or new concerns. The patient verbalized understanding and agreement with plan.    I personally performed the services described  in this documentation, which was scribed in my presence. The recorded information has been reviewed and is accurate.       Everlene FarrierWilliam Luz Mares, PA-C 07/09/15 0022  Gilda Creasehristopher J Pollina, MD 07/09/15 [phone]

## 2015-07-08 NOTE — ED Notes (Signed)
Pt from home for eval of dental pain that comes and goes, pt reports right upper dental pain, cavities and missing teeth noted. Poor dental hygiene as well. No tenderness ntoed, airway intact. nad noted. Pt states has taken aspirin which helps but pain comes back.

## 2015-07-08 NOTE — Discharge Instructions (Signed)
Dental Pain  ° ° °Dental pain may be caused by many things, including:  °Tooth decay (cavities or caries). Cavities expose the nerve of your tooth to air and hot or cold temperatures. This can cause pain or discomfort.  °Abscess or infection. A dental abscess is a collection of infected pus from a bacterial infection in the inner part of the tooth (pulp). It usually occurs at the end of the tooth's root.  °Injury.  °An unknown reason (idiopathic). °Your pain may be mild or severe. It may only occur when:  °You are chewing.  °You are exposed to hot or cold temperature.  °You are eating or drinking sugary foods or beverages, such as soda or candy. °Your pain may also be constant.  °HOME CARE INSTRUCTIONS  °Watch your dental pain for any changes. The following actions may help to lessen any discomfort that you are feeling:  °Take medicines only as directed by your dentist.  °If you were prescribed an antibiotic medicine, finish all of it even if you start to feel better.  °Keep all follow-up visits as directed by your dentist. This is important.  °Do not apply heat to the outside of your face.  °Rinse your mouth or gargle with salt water if directed by your dentist. This helps with pain and swelling.  °You can make salt water by adding ¼ tsp of salt to 1 cup of warm water. °Apply ice to the painful area of your face:  °Put ice in a plastic bag.  °Place a towel between your skin and the bag.  °Leave the ice on for 20 minutes, 2-3 times per day. °Avoid foods or drinks that cause you pain, such as:  °Very hot or very cold foods or drinks.  °Sweet or sugary foods or drinks. °SEEK MEDICAL CARE IF:  °Your pain is not controlled with medicines.  °Your symptoms are worse.  °You have new symptoms. °SEEK IMMEDIATE MEDICAL CARE IF:  °You are unable to open your mouth.  °You are having trouble breathing or swallowing.  °You have a fever.  °Your face, neck, or jaw is swollen. °This information is not intended to replace advice  given to you by your health care provider. Make sure you discuss any questions you have with your health care provider.  °Document Released: 08/21/2005 Document Revised: 01/05/2015 Document Reviewed: 08/17/2014  °Elsevier Interactive Patient Education ©2016 Elsevier Inc.  ° ° °Emergency Department Resource Guide °1) Find a Doctor and Pay Out of Pocket °Although you won't have to find out who is covered by your insurance plan, it is a good idea to ask around and get recommendations. You will then need to call the office and see if the doctor you have chosen will accept you as a new patient and what types of options they offer for patients who are self-pay. Some doctors offer discounts or will set up payment plans for their patients who do not have insurance, but you will need to ask so you aren't surprised when you get to your appointment. ° °2) Contact Your Local Health Department °Not all health departments have doctors that can see patients for sick visits, but many do, so it is worth a call to see if yours does. If you don't know where your local health department is, you can check in your phone book. The CDC also has a tool to help you locate your state's health department, and many state websites also have listings of all of their local health departments. ° °  3) Find a Walk-in Clinic °If your illness is not likely to be very severe or complicated, you may want to try a walk in clinic. These are popping up all over the country in pharmacies, drugstores, and shopping centers. They're usually staffed by nurse practitioners or physician assistants that have been trained to treat common illnesses and complaints. They're usually fairly quick and inexpensive. However, if you have serious medical issues or chronic medical problems, these are probably not your best option. ° °No Primary Care Doctor: °- Call [facility] at  832-8000 - they can help you locate a primary care doctor that  accepts your insurance,  provides certain services, etc. °- Physician Referral Service- 1-800-533-3463 ° °Chronic Pain Problems: °Organization         Address  Phone   Notes  °[facility]  (336) 297-2271 Patients need to be referred by their primary care doctor.  ° °Medication Assistance: °Organization         Address  Phone   Notes  °[place] [facility] [address] °[place], [place] [address] (336) 641-8030 --Must be a resident of [place] °-- Must have NO insurance coverage whatsoever (no Medicaid/ Medicare, etc.) °-- The pt. MUST have a primary care doctor that directs their care regularly and follows them in the community °  °[facility]  (866) 331-1348   °[employer]  (888) 892-1162   ° °Agencies that provide inexpensive medical care: °Organization         Address  Phone   Notes  °[facility]  (336) 832-8035   °[facility]    (336) 832-7272   °[facility] [address] °[place], [place] [address] (336) 832-4777   °[facility] [address], °[place] (336) 271-4999   °[facility]    (336) 373-0678   °[facility]    (336) 272-1050   °[facility] ° [address], [place] Phone:  (336) 832-4444, Fax:  (336) 832-4440 Hours of Operation:  9 am - 6 pm, M-F.  Also accepts Medicaid/Medicare and self-pay.  °[facility] ° [address], [place] Phone: (336) 832-3150, Fax: (336) 832-3151. Hours of Operation:  8:30 am - 5:30 pm, M-F.  Also accepts Medicaid and self-pay.  °[facility] [place] [address], [place] Phone: (336) 878-6027   °[facility] [address], [place],  (336)723-1848, Ext. 123 Mondays & Thursdays: 7-9 AM.  First 15 patients are seen on a first come, first serve basis. °  ° °Medicaid-accepting [place] Providers: ° °Organization         Address  Phone    Notes  °[facility] [address], [place] (336) 641-2100 Also accepts self-pay patients.  °[facility] [address], [place] ° (336) 856-9996   °[facility] [address] [place] [address], [place] (336) 288-8857   °[facility] [address] [place] [address], [place] (336) 299-7000   °Veita Bland [address], [place]  ° (336) 373-1557 Only accepts [place] Access Medicaid patients after they have their name applied to their card.  ° °Self-Pay (no insurance) in [place]: ° °Organization         Address  Phone   Notes  °Sickle Cell Patients, [facility] [address], [place] (336) 832-1970   °[facility] [address], [place] (336) 832-4400   °[facility] ° [address], [place] (336) 992-4800   °[facility]/Dr. Osei-Bonsu ° [address] [place] [address], [place] or [address], [place] (336) 841-8500 Phone number for both [place] and [place] locations is the same.  °[facility] [address], [place] (336) 299-0000   °[facility] [address] [place] [address], [place] or [address] (336) 852-7530 °(336) 878-2260   °[facility] [address], [place] (336) 350-1642, phone; (336) 294-5005, fax Sees patients 1st and 3rd Saturday of every month.  Must not qualify for public or private insurance (i.e. Medicaid, Medicare, [place] Health Choice, Veterans' Benefits) • Household income should be no more than 200% of the poverty level •The clinic cannot treat you if you are pregnant or think you are pregnant • Sexually transmitted diseases are not treated at the clinic.  ° ° °Dental Care: °Organization         Address  Phone  Notes  °[facility] [facility] [address], [place] (336)  641-6152 Accepts children up to age 21 who are enrolled in Medicaid or [place] Health Choice; pregnant women with a Medicaid card; and children who have applied for Medicaid or [place] Health Choice, but were declined, whose parents can pay a reduced fee at time of service.  °[facility] [place]  [address], [place] (336) 641-7733 Accepts children up to age 21 who are enrolled in Medicaid or [place] Health Choice; pregnant women with a Medicaid card; and children who have applied for Medicaid or [place] Health Choice, but were declined, whose parents can pay a reduced fee at time of service.  °[facility] ° [address], [place] (336) 641-4533 Patients are seen by appointment only. Walk-ins are not accepted. [facility] will see patients 18 years of age and older. °Monday - Tuesday (8am-5pm) °Most Wednesdays (8:30-5pm) °$30 per visit, cash only  °[facility] ° [address], [place] (336) 641-4533 Patients are seen by appointment only. Walk-ins are not accepted. [facility] will see patients 18 years of age and older. °One Wednesday Evening (Monthly: Volunteer Based).  $30 per visit, cash only  °[facility]  (919) 537-3737 for adults; Children under age 4, call Graduate Pediatric Dentistry at (919) [phone]. Children aged 4-14, please call (919) 537-3737 to request a pediatric application. ° Dental services are provided in all areas of dental care including fillings, crowns and bridges, complete and partial dentures, implants, gum treatment, root canals, and extractions. Preventive care is also provided. Treatment is provided to both adults and children. °Patients are selected via a lottery and there is often a waiting list. °  °[facility] [address], °[place] ° (336) 763-8833 [email or web address] °  °[facility] [address], [place], [place] (336)723-1848, Ext.  123 Second and Fourth Thursday of each month, opens at 6:30 AM; Clinic ends at 9 AM.  Patients are seen on a first-come first-served basis, and a limited number are seen during each clinic.  ° °[facility] ° [address], [place], [place] (336) 723-7904   Eligibility Requirements °You must have lived in [place],   [place], or [place] counties for at least the last three months. °  You cannot be eligible for state or federal sponsored healthcare insurance, including Veterans Administration, Medicaid, or Medicare. °  You generally cannot be eligible for healthcare insurance through your employer.  °  How to apply: °Eligibility screenings are held every Tuesday and Wednesday afternoon from 1:00 pm until 4:00 pm. You do not need an appointment for the interview!  °[facility] [address], [place], [place] 336-631-2330   °[facility]  336-342-8273   °[facility]  336-703-3100   °[facility]  336-570-6415   ° °Behavioral Health Resources in the Community: °Intensive Outpatient Programs °Organization         Address  Phone  Notes  °[facility] [address], [place], [place] 336-878-6098   °[facility] [address], [place], [place] 336-832-9800   °[facility] [address], [place], [place] ° 336-882-2125   °[facility] [address],  °[place], [place] 1-800-853-5163 or 336-641-4981   °Substance Abuse Resources °Organization         Address  Phone  Notes  °[facility]  336-882-2125   °[facility]  336-784-9470   °[facility]  336-285-9073   °[facility]  336-845-3988   °Residential & Outpatient Substance Abuse Program  1-800-659-3381   °Psychological Services °Organization         Address  Phone  Notes  °[facility]  336- 832-9600   °[facility]  336- 378-7881   °[place] [address], [place] 1-800-853-5163 or 336-641-4981   ° °Mobile Crisis Teams °Organization         Address  Phone  Notes  °[facility], Mobile Crisis Care Unit  1-877-626-1772   °[facility] °[facility] ° [address] [place], [place] 336-834-9664   °Sharon DeEsch [address] °[place] 336-554-5454   ° °Self-Help/Support Groups °Organization         Address  Phone             Notes  °[facility] - variety of support groups  336- 373-1402 Call for more information  °Narcotics Anonymous (NA), [facility] [address], °[place]  2 meetings at this location  ° °Residential Treatment Programs °Organization         Address  Phone  Notes  °[facility] [address],    °[place]  1-866-801-8205   °[facility] ° [address], [place], [place] 704-293-8524   °[facility] [address], [place] 336-845-3988 Admissions: 8am-3pm M-F  °[facility] [address]    °[place], [place] 336-841-1104   °[facility] [address],  [place], [place] 336-379-7146   °[facility] [address]  °, [place] 336-285-9073   °[facility] - Intensive Outpatient [address], , [place] 336-852-3033   °[facility] [address]  °[place], [place] 1-877-615-2722 or 336-784-9470   °[facility] [address] [place], [place] 336-227-7417 Accepts Medicaid  °[facility] [address]  ° [place] 1-800-659-3381 Substance Abuse/Addiction Treatment  ° °[place] Behavioral Health Resources °Organization         Address  Phone  Notes  °[facility]  (888) 581-9988   °Julie Brannon, PhD [address] [place], [place]   (336) 349-5553 or (336) 951-0000   °[facility]   [address] °[place], [place] (336) 349-4454   °[facility] [address], [place], [place] (336) 342-8316 Insurance/Medicaid/sponsorship through [facility]  °[facility] [address]                                    [place], [place] (336) 342-8316 Therapy/tele-psych/case  °[facility] [address]  ° [place], [place] (336) 349-2233    °Dr. Arfeen  (336) 349-4544   °[facility] [place]  [employer] [place] [facility] 1) [address], [place] °2) [address], [place] °3)  [address], [place] (336) 349-3220 °(336) 342-7768 ° °(336) 342-8140   °[place] [facility] (336) 342-1394 or (336) 342-3537 (After Hours)    ° ° ° °

## 2017-03-24 ENCOUNTER — Encounter (HOSPITAL_COMMUNITY): Payer: Self-pay | Admitting: Emergency Medicine

## 2017-03-24 ENCOUNTER — Emergency Department (HOSPITAL_COMMUNITY)
Admission: EM | Admit: 2017-03-24 | Discharge: 2017-03-24 | Disposition: A | Discharge status: Home / Self Care | Payer: Self-pay | Attending: Emergency Medicine | Admitting: Emergency Medicine

## 2017-03-24 ENCOUNTER — Emergency Department (HOSPITAL_COMMUNITY): Payer: Self-pay

## 2017-03-24 DIAGNOSIS — R51 Headache: Secondary | ICD-10-CM

## 2017-03-24 DIAGNOSIS — F329 Major depressive disorder, single episode, unspecified: Secondary | ICD-10-CM | POA: Insufficient documentation

## 2017-03-24 DIAGNOSIS — R519 Headache, unspecified: Secondary | ICD-10-CM

## 2017-03-24 DIAGNOSIS — F17210 Nicotine dependence, cigarettes, uncomplicated: Secondary | ICD-10-CM | POA: Insufficient documentation

## 2017-03-24 DIAGNOSIS — F1010 Alcohol abuse, uncomplicated: Secondary | ICD-10-CM | POA: Insufficient documentation

## 2017-03-24 DIAGNOSIS — R253 Fasciculation: Secondary | ICD-10-CM | POA: Insufficient documentation

## 2017-03-24 DIAGNOSIS — G4489 Other headache syndrome: Secondary | ICD-10-CM | POA: Insufficient documentation

## 2017-03-24 LAB — CBC
HCT: 42.3 % (ref 39.0–52.0)
Hemoglobin: 14.3 g/dL (ref 13.0–17.0)
MCH: 30.8 pg (ref 26.0–34.0)
MCHC: 33.8 g/dL (ref 30.0–36.0)
MCV: 91 fL (ref 78.0–100.0)
PLATELETS: 255 10*3/uL (ref 150–400)
RBC: 4.65 MIL/uL (ref 4.22–5.81)
RDW: 15.8 % — AB (ref 11.5–15.5)
WBC: 5.3 10*3/uL (ref 4.0–10.5)

## 2017-03-24 LAB — RAPID URINE DRUG SCREEN, HOSP PERFORMED
Amphetamines: NOT DETECTED
BENZODIAZEPINES: NOT DETECTED
Barbiturates: NOT DETECTED
COCAINE: NOT DETECTED
OPIATES: NOT DETECTED
Tetrahydrocannabinol: NOT DETECTED

## 2017-03-24 LAB — BASIC METABOLIC PANEL
Anion gap: 9 (ref 5–15)
BUN: 6 mg/dL (ref 6–20)
CO2: 27 mmol/L (ref 22–32)
CREATININE: 0.9 mg/dL (ref 0.61–1.24)
Calcium: 8.8 mg/dL — ABNORMAL LOW (ref 8.9–10.3)
Chloride: 105 mmol/L (ref 101–111)
GFR calc Af Amer: >60 mL/min (ref >60)
Glucose, Bld: 107 mg/dL — ABNORMAL HIGH (ref 65–99)
Potassium: 4 mmol/L (ref 3.5–5.1)
Sodium: 141 mmol/L (ref 135–145)

## 2017-03-24 LAB — ETHANOL: Alcohol, Ethyl (B): 289 mg/dL — ABNORMAL HIGH (ref <5)

## 2017-03-24 LAB — CBG MONITORING, ED: Glucose-Capillary: 98 mg/dL (ref 65–99)

## 2017-03-24 MED ORDER — ACETAMINOPHEN 325 MG PO TABS: 650.0000 mg | ORAL_TABLET | ORAL | Status: DC | PRN

## 2017-03-24 MED ORDER — LORAZEPAM 2 MG/ML IJ SOLN: 0.0000 mg | Freq: Four times a day (QID) | INTRAMUSCULAR | Status: DC

## 2017-03-24 MED ORDER — NICOTINE 14 MG/24HR TD PT24: 14.0000 mg | MEDICATED_PATCH | Freq: Once | TRANSDERMAL | Status: DC

## 2017-03-24 MED ORDER — ONDANSETRON HCL 4 MG PO TABS: 4.0000 mg | ORAL_TABLET | Freq: Three times a day (TID) | ORAL | Status: DC | PRN

## 2017-03-24 MED ORDER — LORAZEPAM 1 MG PO TABS: 0.0000 mg | ORAL_TABLET | Freq: Four times a day (QID) | ORAL | Status: DC

## 2017-03-24 MED ORDER — THIAMINE HCL 100 MG/ML IJ SOLN: 100.0000 mg | Freq: Every day | INTRAMUSCULAR | Status: DC

## 2017-03-24 MED ORDER — LORAZEPAM 1 MG PO TABS: 0.0000 mg | ORAL_TABLET | Freq: Two times a day (BID) | ORAL | Status: DC

## 2017-03-24 MED ORDER — LORAZEPAM 2 MG/ML IJ SOLN: 0.0000 mg | Freq: Two times a day (BID) | INTRAMUSCULAR | Status: DC

## 2017-03-24 MED ORDER — VITAMIN B-1 100 MG PO TABS: 100.0000 mg | ORAL_TABLET | Freq: Every day | ORAL | Status: DC

## 2017-03-24 NOTE — ED Provider Notes (Signed)
TTS evaluated patient, discussed with girlfriend, and consulted with APP [facility] from psychiatry team. They felt that the patient did not meet inpatient criteria and could be managed in the outpatient. He states he has followed with [facility] and we have recommended returning there for his care. Patient provided with outpatient resources guide. He is comfortable and well-appearing at time of discharge, no signs of severe alcohol withdrawal.   Takiah Maiden, Ambrose Finlandachel Morgan, MD 03/24/17 1010

## 2017-03-24 NOTE — ED Notes (Signed)
Pt wanted to leave and not talk to psych. Pt getting more agitated. Pt saying he would rather talk to his family and not a doctor b/c doctors don't know him. Pt states he is unique. Pt is stating this life is temporary, but continues to deny any SI

## 2017-03-24 NOTE — ED Provider Notes (Signed)
Pt refusing to stay in ER I have completed IVC paperwork I have asked security to keep patient in the room    Tyler Maxwell, Tyler Niese, MD 03/24/17 [phone]

## 2017-03-24 NOTE — ED Notes (Signed)
Pt allowed to go out to smoke per dr Bebe Shaggywickline. Pt ran [place] [employer] [facility] [employer]-- IVC paperwork being faxed at present.  Dr. Clarene DukeLittle given emergency 24 hour hold IVC paperwork-- filled out.

## 2017-03-24 NOTE — ED Notes (Signed)
Pt's girlfriend in room when pt went to CT. Pt's gf stated that pt says stuff like "life would be better without me here." Pt's gf states that he will not open up to anyone

## 2017-03-24 NOTE — ED Provider Notes (Signed)
Pt endorsed to dr little to f/u on psych evaluation Pt has IVC paperwork completed and will need psych eval as he is a threat to himself   Zadie RhineWickline, Jennalyn Cawley, MD 03/24/17 [phone]

## 2017-03-24 NOTE — ED Provider Notes (Signed)
[facility]EM[facility]Y DEPT Provider Note   CSN: [id number] Arrival date & time: 03/24/17  0247     History   Chief Complaint Chief Complaint  Patient presents with  . Headache  . Diplopia    HPI Tyler Maxwell is a 31 y.o. male.  The history is provided by the patient and a significant other.  Headache   This is a recurrent problem. The current episode started more than 1 week ago. The problem has been gradually worsening. The pain is moderate. Pertinent negatives include no fever.  pt presents with girlfriend for concern for HA, vision change and "twitching" He has been having headaches for over 2 months Seen at outside hospital, had CT scan and told he had concussion No recent head injuries He has had continued headaches No fever/vomiting Girlfriend reports that recently she has noted "twitching" while he is sleeping.  Now, these episodes occur during day No LOC.  The episodes are brief.   No falls He also mentioned vision changes recently He reports he quit drinking ETOH 4 yrs ago, but recently started drinking beer again Girlfriend reports his headaches are worse when he is NOT drinking  Pt currently denies HA Pt currently denies visual changes  No drug use reported   Past Medical History:  Diagnosis Date  . Depression   . No significant past medical history     Patient Active Problem List   Diagnosis Date Noted  . Alcohol dependence (HCC) 10/13/2013  . Alcohol withdrawal (HCC) 10/13/2013    History reviewed. No pertinent surgical history.     Home Medications    Prior to Admission medications   Not on File    Family History History reviewed. No pertinent family history.  Social History Social History  Substance Use Topics  . Smoking status: Current Some Day Smoker    Packs/day: 1.00    Types: Cigarettes  . Smokeless tobacco: Never Used  . Alcohol use 60.0 oz/week    120 Standard drinks or equivalent per week     Comment: 3-4 40's a day      Allergies   Patient has no known allergies.   Review of Systems Review of Systems  Constitutional: Negative for fever.  Eyes: Positive for visual disturbance.  Gastrointestinal: Negative for abdominal pain.  Neurological: Positive for headaches.  All other systems reviewed and are negative.    Physical Exam Updated Vital Signs BP 110/86 (BP Location: Left Arm)   Pulse 82   Temp 97.8 F (36.6 C) (Oral)   Resp 20   SpO2 99%   Physical Exam CONSTITUTIONAL: Well developed/well nourished HEAD: Normocephalic/atraumatic EYES: EOMI/PERRL, no nystagmus, no ptosis ENMT: Mucous membranes moist NECK: supple no meningeal signs, no bruits SPINE/BACK:entire spine nontender CV: S1/S2 noted, no murmurs/rubs/gallops noted LUNGS: Lungs are clear to auscultation bilaterally, no apparent distress ABDOMEN: soft, nontender, no rebound or guarding GU:no cva tenderness NEURO:Awake/alert, face symmetric, no arm or leg drift is noted Equal 5/5 strength with shoulder abduction, elbow flex/extension, wrist flex/extension in upper extremities and equal hand grips bilaterally Equal 5/5 strength with hip flexion,knee flex/extension, foot dorsi/plantar flexion Cranial nerves 3/4/5/6/03/12/09/11/12 tested and intact Gait normal without ataxia No past pointing EXTREMITIES: pulses normal, full ROM SKIN: warm, color normal PSYCH: no abnormalities of mood noted, alert and oriented to situation    ED Treatments / Results  Labs (all labs ordered are listed, but only abnormal results are displayed) Labs Reviewed  BASIC METABOLIC PANEL - Abnormal; Notable for the following:  Result Value   Glucose, Bld 107 (*)    Calcium 8.8 (*)    All other components within normal limits  CBC - Abnormal; Notable for the following:    RDW 15.8 (*)    All other components within normal limits  ETHANOL - Abnormal; Notable for the following:    Alcohol, Ethyl (B) 289 (*)    All other components within  normal limits  RAPID URINE DRUG SCREEN, HOSP PERFORMED  CBG MONITORING, ED    EKG  EKG Interpretation None       Radiology Ct Head Wo Contrast  Result Date: 03/24/2017 CLINICAL DATA:  Headache. EXAM: CT HEAD WITHOUT CONTRAST TECHNIQUE: Contiguous axial images were obtained from the base of the skull through the vertex without intravenous contrast. COMPARISON:  None. FINDINGS: Brain: No intracranial hemorrhage, mass effect, or midline shift. No hydrocephalus. The basilar cisterns are patent. No evidence of territorial infarct. No extra-axial or intracranial fluid collection. Vascular: No hyperdense vessel or unexpected calcification. Skull: Normal. Negative for fracture or focal lesion. Sinuses/Orbits: Mucosal thickening throughout the ethmoid air cells. No fluid levels. Mastoid air cells are clear. Visualized orbits are unremarkable. Other: None. IMPRESSION: No acute intracranial abnormality. Electronically Signed   By: Rubye OaksMelanie  Ehinger M.D.   On: 03/24/2017 06:16    Procedures Procedures (including critical care time)  Medications Ordered in ED Medications - No data to display   Initial Impression / Assessment and Plan / ED Course  I have reviewed the triage vital signs and the nursing notes.  Pertinent labs   results that were available during my care of the patient were reviewed by me and considered in my medical decision making (see chart for details).     5:34 AM Will perform CT head Labs pending No complaints at this time No neuro deficits During exam pt had brief episode - it appears to be more c/w tic.  No seizures.  No LOC It is very brief 6:43 AM Ct head negative Pt is intoxicated, ETOH >200 He is awake/alert, standing at this time Girlfriend now reports pt has expressed suicidal thoughts Pt denies this However he agrees to have a psych consultation  As for muscle twitching, no seizure activity noted He can f/u with neurology as outpatient for this and his  headaches If he is cleared by psych he may go home Suspect ETOH is playing a role in his symptoms  Final Clinical Impressions(s) / ED Diagnoses   Final diagnoses:  Other headache syndrome  Alcohol abuse  Muscle twitching    New Prescriptions New Prescriptions   No medications on file     Zadie RhineWickline, Fernand Sorbello, MD 03/24/17 [phone]

## 2017-03-24 NOTE — ED Notes (Signed)
TTS completed, sitter at bedside, [employer] no longer in presence of patient

## 2017-03-24 NOTE — ED Triage Notes (Signed)
Patient with headache, double vision and twitching or girlfriend said that it could be possibly seizure like activity.  He twitches his head and upper body.  Girl friend states that he tried to get up to stand and couldn't get up.  Patient is CAOx4 and GCS of 15.

## 2017-03-24 NOTE — ED Notes (Signed)
Pt brought back by [employer]- [employer] sitting with pt. Pt changed into purple scrubs without incident.

## 2017-03-24 NOTE — ED Notes (Addendum)
Pt stated he can hear birds talking to him. No direct words stated by pt. MD notified

## 2017-03-24 NOTE — BH Assessment (Addendum)
Tele Assessment Note   Tyler Maxwell is a 31 y.o. male who came in voluntarily to [facility] due to headaches, twitching and double vision. Pt's BAL was registered at 289. CT scan was done and was negative. Pt's GF/fiance mentioned to RN that pt had made some passive SI statements like "life would be better without me here". Pt agreed to stay for a psych consult but subsequently decided he didn't want to stay so he was IVC'd.   Prior to assessment with pt, clinician spoke to pt's GF/fiance, [name] [phone]), for collateral information. [name] shared that pt has seemed "really down" for @ a year due to having trouble finding work b/c he is a felon. She reports that pt has made many passive statements such as "I would be better off dead" & "life would be better without me here". She denies that pt has ever had a suicide attempt or verbalized any specific plan of suicide. [name] also disclosed that pt started to appear paranoid around March, saying things were crawling around in his brain and sometimes having trouble getting his thoughts together when conversing with her. Lastly, [name] shared that pt has started to have these brief twitching episodes, akin to Tourette's tics.  Pt was pleasant and upbeat during assessment. 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 He discussed getting certified for the work and expressed excitement about putting his boys in a position where they wouldn't have to struggle. Pt denied SI, HI, AVH. After further prompting from clinician, pt admitted to having hallucinations when withdrawing from marijuana @4  months ago. Pt did not appear to be operating in any delusional thought content nor appear to be responding to any internal stimuli throughout assessment.   Case staffed with Elta Guadeloupe, NP, who agreed that there is not enough compelling evidence to uphold pt on IVC or recommend IP crisis  stabilization treatment. Pt is recommended for outpatient therapy and substance abuse treatment.   Diagnosis: Alcohol Use d/o, severe  Past Medical History:  Past Medical History:  Diagnosis Date  . Depression   . No significant past medical history     History reviewed. No pertinent surgical history.  Family History: History reviewed. No pertinent family history.  Social History:  reports that he has been smoking Cigarettes.  He has been smoking about 1.00 pack per day. He has never used smokeless tobacco. He reports that he drinks about 60.0 oz of alcohol per week . He reports that he does not use drugs.  Additional Social History:  Alcohol / Drug Use Pain Medications: denies Prescriptions: denies Over the Counter: denies History of alcohol / drug use?: Yes Longest period of sobriety (when/how long): 4 years/ up until a month ago Substance #1 Name of Substance 1: alcohol 1 - Age of First Use: 12 1 - Duration: ongoing 1 - Last Use / Amount: last night  CIWA: CIWA-Ar BP: 110/86 Pulse Rate: 82 COWS:    PATIENT STRENGTHS: (choose at least two) Active sense of humor Average or above average intelligence Capable of independent living Communication skills  Allergies: No Known Allergies  Home Medications:  (Not in a hospital admission)  OB/GYN Status:  No LMP for male patient.  General Assessment Data Location of Assessment: [facility] ED TTS Assessment: In system Is this a Tele or Face-to-Face Assessment?: Tele Assessment Is this an Initial Assessment or a Re-assessment for this encounter?: Initial Assessment Marital status: Long term  relationship Living Arrangements: Spouse/significant other, Children Can pt return to current living arrangement?: Yes Admission Status: Involuntary Is patient capable of signing voluntary admission?: Yes Referral Source: Self/Family/Friend Insurance type: none     Crisis Care Plan Living Arrangements: Spouse/significant other,  Children Name of Psychiatrist: none Name of Therapist: none  Education Status Is patient currently in school?: No  Risk to self with the past 6 months Suicidal Ideation: No Has patient been a risk to self within the past 6 months prior to admission? : No Suicidal Intent: No Has patient had any suicidal intent within the past 6 months prior to admission? : No Is patient at risk for suicide?: No Suicidal Plan?: No Has patient had any suicidal plan within the past 6 months prior to admission? : No Access to Means: No What has been your use of drugs/alcohol within the last 12 months?: see above Previous Attempts/Gestures: No Intentional Self Injurious Behavior: None Family Suicide History: Unknown Persecutory voices/beliefs?: No Depression: No Substance abuse history and/or treatment for substance abuse?: Yes Suicide prevention information given to non-admitted patients: Not applicable  Risk to Others within the past 6 months Homicidal Ideation: No Does patient have any lifetime risk of violence toward others beyond the six months prior to admission? : No Thoughts of Harm to Others: No Current Homicidal Intent: No Current Homicidal Plan: No Access to Homicidal Means: No History of harm to others?: No Assessment of Violence: None Noted Does patient have access to weapons?: No Criminal Charges Pending?: No Does patient have a court date: No Is patient on probation?: No  Psychosis Hallucinations: None noted Delusions: None noted  Mental Status Report Appearance/Hygiene: Unremarkable Eye Contact: Good Motor Activity: Unremarkable Speech: Logical/coherent Level of Consciousness: Alert Mood: Pleasant, Euthymic Affect: Appropriate to circumstance Anxiety Level: Minimal Thought Processes: Coherent, Relevant Judgement: Unimpaired Orientation: Person, Place, Time, Situation Obsessive Compulsive Thoughts/Behaviors: None  Cognitive Functioning Concentration: Normal Memory:  Recent Intact, Remote Intact IQ: Average Insight: Fair Impulse Control: Good Appetite: Fair Sleep: No Change Total Hours of Sleep: 6 Vegetative Symptoms: None  ADLScreening [facility] Assessment Services) Patient's cognitive ability adequate to safely complete daily activities?: Yes Patient able to express need for assistance with ADLs?: Yes Independently performs ADLs?: Yes (appropriate for developmental age)  Prior Inpatient Therapy Prior Inpatient Therapy: Yes Prior Therapy Dates: 2014; 2015 Prior Therapy Facilty/Provider(s): [facility] Reason for Treatment: Alcohol abuse  Prior Outpatient Therapy Prior Outpatient Therapy: No Does patient have an ACCT team?: No Does patient have Intensive In-House Services?  : No Does patient have [facility] services? : No Does patient have P4CC services?: No  ADL Screening (condition at time of admission) Patient's cognitive ability adequate to safely complete daily activities?: Yes Is the patient deaf or have difficulty hearing?: No Does the patient have difficulty seeing, even when wearing glasses/contacts?: No Does the patient have difficulty concentrating, remembering, or making decisions?: No Patient able to express need for assistance with ADLs?: Yes Does the patient have difficulty dressing or bathing?: No Independently performs ADLs?: Yes (appropriate for developmental age) Does the patient have difficulty walking or climbing stairs?: No Weakness of Legs: None Weakness of Arms/Hands: None  Home Assistive Devices/Equipment Home Assistive Devices/Equipment: None    Abuse/Neglect Assessment (Assessment to be complete while patient is alone) Physical Abuse: Denies Verbal Abuse: Denies Sexual Abuse: Denies Exploitation of patient/patient's resources: Denies Self-Neglect: Denies Values / Beliefs Cultural Requests During Hospitalization: None Spiritual Requests During Hospitalization: None   Advance Directives (For Healthcare) Does  Patient  Have a Medical Advance Directive?: No Would patient like information on creating a medical advance directive?: No - Patient declined    Additional Information 1:1 In Past 12 Months?: No CIRT Risk: No Elopement Risk: No Does patient have medical clearance?: Yes     Disposition:  Disposition Initial Assessment Completed for this Encounter: Yes (consulted with Elta GuadeloupeLaurie Parks, NP) Disposition of Patient: Outpatient treatment Type of outpatient treatment: Adult (Outpatient therapy & substance abuse treatment recommended)  Laddie AquasSamantha M Afton Lavalle 03/24/2017 9:01 AM

## 2017-03-29 ENCOUNTER — Telehealth: Payer: Self-pay | Admitting: *Deleted

## 2017-03-29 ENCOUNTER — Ambulatory Visit: Payer: Self-pay | Admitting: Neurology

## 2017-03-29 NOTE — Telephone Encounter (Signed)
No showed new patient appointment. 

## 2017-04-02 ENCOUNTER — Encounter: Payer: Self-pay | Admitting: Neurology

## 2017-04-22 ENCOUNTER — Emergency Department (HOSPITAL_COMMUNITY): Payer: Self-pay

## 2017-04-22 ENCOUNTER — Emergency Department (HOSPITAL_COMMUNITY)
Admission: EM | Admit: 2017-04-22 | Discharge: 2017-04-22 | Disposition: A | Discharge status: Home / Self Care | Payer: Self-pay | Attending: Emergency Medicine | Admitting: Emergency Medicine

## 2017-04-22 DIAGNOSIS — Y929 Unspecified place or not applicable: Secondary | ICD-10-CM | POA: Insufficient documentation

## 2017-04-22 DIAGNOSIS — S60552A Superficial foreign body of left hand, initial encounter: Secondary | ICD-10-CM | POA: Insufficient documentation

## 2017-04-22 DIAGNOSIS — F17210 Nicotine dependence, cigarettes, uncomplicated: Secondary | ICD-10-CM | POA: Insufficient documentation

## 2017-04-22 DIAGNOSIS — Y93H3 Activity, building and construction: Secondary | ICD-10-CM | POA: Insufficient documentation

## 2017-04-22 DIAGNOSIS — W294XXA Contact with nail gun, initial encounter: Secondary | ICD-10-CM | POA: Insufficient documentation

## 2017-04-22 DIAGNOSIS — Y990 Civilian activity done for income or pay: Secondary | ICD-10-CM | POA: Insufficient documentation

## 2017-04-22 MED ORDER — CEPHALEXIN 500 MG PO CAPS: 500.0000 mg | ORAL_CAPSULE | Freq: Four times a day (QID) | ORAL | 0 refills | Status: DC

## 2017-04-22 NOTE — ED Notes (Signed)
Patient was alert, oriented and stable upon discharge. RN went over AVS and patient had no further questions.  

## 2017-04-22 NOTE — ED Triage Notes (Signed)
Pt states that he has had L hand swelling x 3 days. Unsure if he hit the posterior side with a nail gun but anterior side is swollen. Alert and oriented.

## 2017-04-22 NOTE — ED Provider Notes (Signed)
[facility]-EMERGENCY DEPT Provider Note   CSN: [id number] Arrival date & time: 04/22/17  1646  By signing my name below, I, Tyler Maxwell, attest that this documentation has been prepared under the direction and in the presence of Tyler Maxwell, [place]. Electronically Signed: Karren Cobble, ED Scribe. 04/22/17. 5:44 PM.  History   Chief Complaint Chief Complaint  Patient presents with  . Hand Pain   The history is provided by the patient. No language interpreter was used.   HPI Comments: Tyler Maxwell is a 31 y.o. male with no pertinent history, who presents to the Emergency Department complaining of sudden onset, persistent, left hand pain that began six days ago. He notes associated swelling around the thumb area of the hand. Pt reports he was at work and he was using a nail gun but he states he is unsure if there is a nail inside. He has tried icing the area with and states the swelling has improved. TDap is up to date. Denies fever or chills no other complaints noted.   Past Medical History:  Diagnosis Date  . Depression   . No significant past medical history    Patient Active Problem List   Diagnosis Date Noted  . Alcohol dependence (HCC) 10/13/2013  . Alcohol withdrawal (HCC) 10/13/2013   No past surgical history on file.  Home Medications    Prior to Admission medications   Not on File   Family History No family history on file.  Social History Social History  Substance Use Topics  . Smoking status: Current Some Day Smoker    Packs/day: 1.00    Types: Cigarettes  . Smokeless tobacco: Never Used  . Alcohol use 60.0 oz/week    120 Standard drinks or equivalent per week     Comment: 3-4 40's a day   Allergies   Patient has no known allergies.  Review of Systems Review of Systems  Constitutional: Negative for fever.  Musculoskeletal: Positive for arthralgias and joint swelling.  All other systems reviewed and are negative.   Physical Exam Updated Vital Signs BP  122/84 (BP Location: Right Arm)   Pulse 87   Temp 98.6 F (37 C) (Oral)   Resp 16   SpO2 97%   Physical Exam  Constitutional: He is oriented to person, place, and time. He appears well-developed and well-nourished.  HENT:  Head: Normocephalic.  Eyes: EOM are normal.  Neck: Normal range of motion.  Pulmonary/Chest: Effort normal.  Abdominal: He exhibits no distension.  Musculoskeletal: Normal range of motion.  Slight edema to the thenar eminence. No sign of infection. Healed puncture wound.  Neurological: He is alert and oriented to person, place, and time.  Psychiatric: He has a normal mood and affect.  Nursing note and vitals reviewed.   ED Treatments / Results  DIAGNOSTIC STUDIES: Oxygen Saturation is 97% on RA, adequate by my interpretation.   COORDINATION OF CARE: 5:41 PM-Discussed next steps with pt. Pt verbalized understanding and is agreeable with the plan.   Labs (all labs ordered are listed, but only abnormal results are displayed) Labs Reviewed - No data to display  EKG  EKG Interpretation None       Radiology Dg Hand Complete Left  Result Date: 04/22/2017 CLINICAL DATA:  Nail gun injury EXAM: LEFT HAND - COMPLETE 3+ VIEW COMPARISON:  None. FINDINGS: Frontal, oblique, and lateral views were obtained. There is a fragment of nail in the soft tissues at the level of the mid first metacarpal. On submitted images,  there is no fracture or dislocation. The nail fragment is close to the first metacarpal; it is probably separate from bone, although a well-defined space between the needle fragment in the bone is not demonstrated on current images. Elsewhere there is no fracture or dislocation. Joint spaces appear normal. No erosive change. IMPRESSION: Metallic fragment of nail measuring 1.8 cm in length is noted immediately adjacent to the mid first metacarpal. This nail fragment is is not seen to convincingly traverse bone, and there is no fracture in this area. However,  a well-defined soft tissue space between the nail fragment in the bone is not seen. It may be prudent to obtain dedicated images of the from with exaggerated oblique views to confirm that the bone is not traversed in this area. Elsewhere no fracture or dislocation. Joint spaces appear normal. No erosive change. No appreciable soft tissue air. Electronically Signed   By: Bretta Bang III M.D.   On: 04/22/2017 17:17    Procedures Procedures (including critical care time)  Medications Ordered in ED Medications - No data to display   Initial Impression / Assessment and Plan / ED Course  I have reviewed the triage vital signs and the nursing notes.  Pertinent labs & imaging results that were available during my care of the patient were reviewed by me and considered in my medical decision making (see chart for details).       Final Clinical Impressions(s) / ED Diagnoses   Final diagnoses:  Foreign body of left hand, initial encounter    New Prescriptions Discharge Medication List as of 04/22/2017  5:55 PM    START taking these medications   Details  cephALEXin (KEFLEX) 500 MG capsule Take 1 capsule (500 mg total) by mouth 4 (four) times daily., Starting Sun 04/22/2017, Print      An After Visit Summary was printed and given to the patient.  I personally performed the services in this documentation, which was scribed in my presence.  The recorded information has been reviewed and considered.   Barnet Pall.   Elson Areas, PA-C 04/22/17 2230    Geoffery Lyons, MD 04/22/17 2300

## 2017-04-22 NOTE — ED Notes (Signed)
Pt transported to XR.  

## 2017-04-24 ENCOUNTER — Ambulatory Visit (INDEPENDENT_AMBULATORY_CARE_PROVIDER_SITE_OTHER): Payer: Worker's Compensation | Admitting: Orthopaedic Surgery

## 2017-04-24 DIAGNOSIS — S6990XA Unspecified injury of unspecified wrist, hand and finger(s), initial encounter: Secondary | ICD-10-CM | POA: Insufficient documentation

## 2017-04-24 DIAGNOSIS — W294XXA Contact with nail gun, initial encounter: Secondary | ICD-10-CM

## 2017-04-24 NOTE — Progress Notes (Signed)
Office Visit Note   Patient: Tyler Maxwell           Date of Birth: 04/09/1986           MRN: [id number] Visit Date: 04/24/2017              Requested by: No referring provider defined for this encounter. PCP: Patient, No Pcp Per   Assessment & Plan: Visit Diagnoses:  1. Injury of hand by nail gun, initial encounter     Plan: X-rays from the ER shows a retained nail through the thumb metacarpal that is protruding on both sides of the cortices. Recommendation is for removal given risk for neurovascular injury or tendon rupture if left in the hand. We discussed the risks benefits alternatives to surgery he understands and wishes to proceed. We'll plan on doing this within a couple days. Total face to face encounter time was greater than 45 minutes and over half of this time was spent in counseling and/or coordination of care.  Follow-Up Instructions: Return in about 2 weeks (around 05/08/2017).   Orders:  No orders of the defined types were placed in this encounter.  No orders of the defined types were placed in this encounter.     Procedures: No procedures performed   Clinical Data: No additional findings.   Subjective: No chief complaint on file.   Patient is a healthy right-hand dominant 31 year old gentleman who shot himself in the left hand with a nail gun at work on 04/22/2017. He was evaluated in the ER this past Sunday and x-ray showed a retained nail. He is placed on oral Keflex. He is currently wearing a thumb spica brace. He does endorse swelling. Denies any numbness and tingling. Denies any constitutional symptoms.    Review of Systems  Constitutional: Negative.   All other systems reviewed and are negative.    Objective: Vital Signs: There were no vitals taken for this visit.  Physical Exam  Constitutional: He is oriented to person, place, and time. He appears well-developed and well-nourished.  HENT:  Head: Normocephalic and atraumatic.  Eyes:  Pupils are equal, round, and reactive to light.  Neck: Neck supple.  Pulmonary/Chest: Effort normal.  Abdominal: Soft.  Musculoskeletal: Normal range of motion.  Neurological: He is alert and oriented to person, place, and time.  Skin: Skin is warm.  Psychiatric: He has a normal mood and affect. His behavior is normal. Judgment and thought content normal.  Nursing note and vitals reviewed.   Ortho Exam Left hand exam shows mild swelling without any signs of infection or cellulitis. His motor and sensory functions are all normal. Thumb and hand are warm well-perfused. Specialty Comments:  No specialty comments available.  Imaging: No results found.   PMFS History: Patient Active Problem List   Diagnosis Date Noted  . Injury of hand by nail gun, initial encounter 04/24/2017  . Alcohol dependence (HCC) 10/13/2013  . Alcohol withdrawal (HCC) 10/13/2013   Past Medical History:  Diagnosis Date  . Depression   . No significant past medical history     No family history on file.  No past surgical history on file. Social History   Occupational History  . Not on file.   Social History Main Topics  . Smoking status: Current Some Day Smoker    Packs/day: 1.00    Types: Cigarettes  . Smokeless tobacco: Never Used  . Alcohol use 60.0 oz/week    120 Standard drinks or equivalent per week  Comment: 3-4 40's a day  . Drug use: No     Comment: Former cocaine user  . Sexual activity: Not on file     Comment: unknown

## 2017-04-25 ENCOUNTER — Other Ambulatory Visit (INDEPENDENT_AMBULATORY_CARE_PROVIDER_SITE_OTHER): Payer: Self-pay | Admitting: Orthopaedic Surgery

## 2017-04-25 ENCOUNTER — Telehealth (INDEPENDENT_AMBULATORY_CARE_PROVIDER_SITE_OTHER): Payer: Self-pay

## 2017-04-25 DIAGNOSIS — S60552A Superficial foreign body of left hand, initial encounter: Secondary | ICD-10-CM

## 2017-04-25 NOTE — Telephone Encounter (Signed)
FYI-Faxed yesterdays office note and surgery request to wc adjustor. Said once she reviews it she will send [place] the approval. She is aware Dr. Roda Shutters is wanting to be done tomorrow and she said this should not be a problem.

## 2017-04-26 ENCOUNTER — Ambulatory Visit (HOSPITAL_BASED_OUTPATIENT_CLINIC_OR_DEPARTMENT_OTHER): Payer: Worker's Compensation | Admitting: Anesthesiology

## 2017-04-26 ENCOUNTER — Encounter (HOSPITAL_BASED_OUTPATIENT_CLINIC_OR_DEPARTMENT_OTHER)
Admission: RE | Disposition: A | Discharge status: Home / Self Care | Payer: Self-pay | Source: Ambulatory Visit | Attending: Orthopaedic Surgery

## 2017-04-26 ENCOUNTER — Encounter (HOSPITAL_BASED_OUTPATIENT_CLINIC_OR_DEPARTMENT_OTHER): Payer: Self-pay | Admitting: *Deleted

## 2017-04-26 ENCOUNTER — Ambulatory Visit (HOSPITAL_BASED_OUTPATIENT_CLINIC_OR_DEPARTMENT_OTHER)
Admission: RE | Admit: 2017-04-26 | Discharge: 2017-04-26 | Disposition: A | Discharge status: Home / Self Care | Payer: Worker's Compensation | Source: Ambulatory Visit | Attending: Orthopaedic Surgery | Admitting: Orthopaedic Surgery

## 2017-04-26 DIAGNOSIS — Y990 Civilian activity done for income or pay: Secondary | ICD-10-CM | POA: Insufficient documentation

## 2017-04-26 DIAGNOSIS — W294XXA Contact with nail gun, initial encounter: Secondary | ICD-10-CM | POA: Insufficient documentation

## 2017-04-26 DIAGNOSIS — M795 Residual foreign body in soft tissue: Secondary | ICD-10-CM | POA: Diagnosis not present

## 2017-04-26 DIAGNOSIS — S61042A Puncture wound with foreign body of left thumb without damage to nail, initial encounter: Secondary | ICD-10-CM | POA: Insufficient documentation

## 2017-04-26 DIAGNOSIS — S60552A Superficial foreign body of left hand, initial encounter: Secondary | ICD-10-CM

## 2017-04-26 DIAGNOSIS — F17210 Nicotine dependence, cigarettes, uncomplicated: Secondary | ICD-10-CM | POA: Diagnosis not present

## 2017-04-26 DIAGNOSIS — M79642 Pain in left hand: Secondary | ICD-10-CM | POA: Diagnosis not present

## 2017-04-26 HISTORY — PX: FOREIGN BODY REMOVAL: SHX962

## 2017-04-26 SURGERY — FOREIGN BODY REMOVAL ADULT
Anesthesia: General | Site: Hand | Laterality: Left

## 2017-04-26 MED ORDER — MIDAZOLAM HCL 2 MG/2ML IJ SOLN
INTRAMUSCULAR | Status: AC
  Filled 2017-04-26: qty 2

## 2017-04-26 MED ORDER — HYDROCODONE-ACETAMINOPHEN 5-325 MG PO TABS: 1.0000 | ORAL_TABLET | Freq: Four times a day (QID) | ORAL | 0 refills | Status: DC | PRN

## 2017-04-26 MED ORDER — MIDAZOLAM HCL 5 MG/5ML IJ SOLN
INTRAMUSCULAR | Status: DC | PRN
  Administered 2017-04-26: 2 mg via INTRAVENOUS

## 2017-04-26 MED ORDER — ONDANSETRON HCL 4 MG/2ML IJ SOLN
INTRAMUSCULAR | Status: DC | PRN
  Administered 2017-04-26: 4 mg via INTRAVENOUS

## 2017-04-26 MED ORDER — GABAPENTIN 300 MG PO CAPS
300.0000 mg | ORAL_CAPSULE | Freq: Once | ORAL | Status: AC
  Administered 2017-04-26: 300 mg via ORAL

## 2017-04-26 MED ORDER — FENTANYL CITRATE (PF) 100 MCG/2ML IJ SOLN
INTRAMUSCULAR | Status: AC
  Filled 2017-04-26: qty 2

## 2017-04-26 MED ORDER — LACTATED RINGERS IV SOLN
INTRAVENOUS | Status: DC
  Administered 2017-04-26: 12:00:00 via INTRAVENOUS

## 2017-04-26 MED ORDER — 0.9 % SODIUM CHLORIDE (POUR BTL) OPTIME
TOPICAL | Status: DC | PRN
  Administered 2017-04-26: 1000 mL

## 2017-04-26 MED ORDER — ACETAMINOPHEN 500 MG PO TABS
ORAL_TABLET | ORAL | Status: AC
  Filled 2017-04-26: qty 2

## 2017-04-26 MED ORDER — PROMETHAZINE HCL 25 MG/ML IJ SOLN: 6.2500 mg | INTRAMUSCULAR | Status: DC | PRN

## 2017-04-26 MED ORDER — GABAPENTIN 300 MG PO CAPS
ORAL_CAPSULE | ORAL | Status: AC
  Filled 2017-04-26: qty 1

## 2017-04-26 MED ORDER — BUPIVACAINE HCL (PF) 0.5 % IJ SOLN
INTRAMUSCULAR | Status: AC
  Filled 2017-04-26: qty 30

## 2017-04-26 MED ORDER — PROPOFOL 10 MG/ML IV BOLUS
INTRAVENOUS | Status: DC | PRN
  Administered 2017-04-26: 200 mg via INTRAVENOUS

## 2017-04-26 MED ORDER — ACETAMINOPHEN 500 MG PO TABS
1000.0000 mg | ORAL_TABLET | Freq: Once | ORAL | Status: AC
  Administered 2017-04-26: 1000 mg via ORAL

## 2017-04-26 MED ORDER — AMOXICILLIN-POT CLAVULANATE 875-125 MG PO TABS: 1.0000 | ORAL_TABLET | Freq: Two times a day (BID) | ORAL | 0 refills | Status: AC

## 2017-04-26 MED ORDER — CEFAZOLIN SODIUM-DEXTROSE 2-4 GM/100ML-% IV SOLN
2.0000 g | INTRAVENOUS | Status: AC
  Administered 2017-04-26: 2 g via INTRAVENOUS

## 2017-04-26 MED ORDER — DEXAMETHASONE SODIUM PHOSPHATE 10 MG/ML IJ SOLN
INTRAMUSCULAR | Status: DC | PRN
  Administered 2017-04-26: 10 mg via INTRAVENOUS

## 2017-04-26 MED ORDER — FENTANYL CITRATE (PF) 100 MCG/2ML IJ SOLN: 50.0000 ug | INTRAMUSCULAR | Status: DC | PRN

## 2017-04-26 MED ORDER — CELECOXIB 200 MG PO CAPS
ORAL_CAPSULE | ORAL | Status: AC
  Filled 2017-04-26: qty 2

## 2017-04-26 MED ORDER — MIDAZOLAM HCL 2 MG/2ML IJ SOLN: 1.0000 mg | INTRAMUSCULAR | Status: DC | PRN

## 2017-04-26 MED ORDER — DEXAMETHASONE SODIUM PHOSPHATE 10 MG/ML IJ SOLN
INTRAMUSCULAR | Status: AC
  Filled 2017-04-26: qty 1

## 2017-04-26 MED ORDER — ONDANSETRON HCL 4 MG/2ML IJ SOLN
INTRAMUSCULAR | Status: AC
  Filled 2017-04-26: qty 2

## 2017-04-26 MED ORDER — FENTANYL CITRATE (PF) 100 MCG/2ML IJ SOLN
INTRAMUSCULAR | Status: DC | PRN
  Administered 2017-04-26 (×2): 50 ug via INTRAVENOUS

## 2017-04-26 MED ORDER — SCOPOLAMINE 1 MG/3DAYS TD PT72: 1.0000 | MEDICATED_PATCH | Freq: Once | TRANSDERMAL | Status: DC | PRN

## 2017-04-26 MED ORDER — LIDOCAINE 2% (20 MG/ML) 5 ML SYRINGE
INTRAMUSCULAR | Status: DC | PRN
  Administered 2017-04-26: 100 mg via INTRAVENOUS

## 2017-04-26 MED ORDER — LIDOCAINE 2% (20 MG/ML) 5 ML SYRINGE
INTRAMUSCULAR | Status: AC
  Filled 2017-04-26: qty 5

## 2017-04-26 MED ORDER — CEFAZOLIN SODIUM-DEXTROSE 2-4 GM/100ML-% IV SOLN
INTRAVENOUS | Status: AC
  Filled 2017-04-26: qty 100

## 2017-04-26 MED ORDER — FENTANYL CITRATE (PF) 100 MCG/2ML IJ SOLN
25.0000 ug | INTRAMUSCULAR | Status: DC | PRN
  Administered 2017-04-26: 50 ug via INTRAVENOUS
  Administered 2017-04-26 (×2): 25 ug via INTRAVENOUS

## 2017-04-26 MED ORDER — CELECOXIB 400 MG PO CAPS
400.0000 mg | ORAL_CAPSULE | Freq: Once | ORAL | Status: AC
  Administered 2017-04-26: 400 mg via ORAL

## 2017-04-26 SURGICAL SUPPLY — 33 items
BANDAGE ACE 4X5 VEL STRL LF (GAUZE/BANDAGES/DRESSINGS) ×3 IMPLANT
BLADE SURG 15 STRL LF DISP TIS (BLADE) ×1 IMPLANT
BLADE SURG 15 STRL SS (BLADE) ×2
BNDG ESMARK 4X9 LF (GAUZE/BANDAGES/DRESSINGS) ×3 IMPLANT
CANISTER SUCT 1200ML W/VALVE (MISCELLANEOUS) ×3 IMPLANT
COVER BACK TABLE 60X90IN (DRAPES) ×3 IMPLANT
CUFF TOURNIQUET SINGLE 24IN (TOURNIQUET CUFF) ×3 IMPLANT
DRAPE EXTREMITY T 121X128X90 (DRAPE) ×6 IMPLANT
DRAPE IMP U-DRAPE 54X76 (DRAPES) ×3 IMPLANT
DRAPE OEC MINIVIEW 54X84 (DRAPES) ×3 IMPLANT
DRAPE U-SHAPE 47X51 STRL (DRAPES) ×3 IMPLANT
ELECT REM PT RETURN 9FT ADLT (ELECTROSURGICAL) ×3
ELECTRODE REM PT RTRN 9FT ADLT (ELECTROSURGICAL) ×1 IMPLANT
GAUZE SPONGE 4X4 12PLY STRL (GAUZE/BANDAGES/DRESSINGS) ×3 IMPLANT
GAUZE XEROFORM 1X8 LF (GAUZE/BANDAGES/DRESSINGS) ×3 IMPLANT
GLOVE SKINSENSE NS SZ7.5 (GLOVE) ×2
GLOVE SKINSENSE STRL SZ7.5 (GLOVE) ×1 IMPLANT
GLOVE SURG SYN 7.5  E (GLOVE) ×2
GLOVE SURG SYN 7.5 E (GLOVE) ×1 IMPLANT
GOWN STRL REIN XL XLG (GOWN DISPOSABLE) ×3 IMPLANT
GOWN STRL REUS W/ TWL LRG LVL3 (GOWN DISPOSABLE) ×1 IMPLANT
GOWN STRL REUS W/TWL LRG LVL3 (GOWN DISPOSABLE) ×2
NS IRRIG 1000ML POUR BTL (IV SOLUTION) ×3 IMPLANT
PACK BASIN DAY SURGERY FS (CUSTOM PROCEDURE TRAY) ×3 IMPLANT
PAD CAST 4YDX4 CTTN HI CHSV (CAST SUPPLIES) ×1 IMPLANT
PADDING CAST COTTON 4X4 STRL (CAST SUPPLIES) ×2
SLEEVE SCD COMPRESS KNEE MED (MISCELLANEOUS) ×3 IMPLANT
SPONGE LAP 18X18 X RAY DECT (DISPOSABLE) ×3 IMPLANT
STOCKINETTE 4X48 STRL (DRAPES) ×3 IMPLANT
SUT ETHILON 4 0 PS 2 18 (SUTURE) ×3 IMPLANT
SYR BULB 3OZ (MISCELLANEOUS) ×3 IMPLANT
TOWEL OR 17X24 6PK STRL BLUE (TOWEL DISPOSABLE) ×3 IMPLANT
UNDERPAD 30X30 (UNDERPADS AND DIAPERS) ×3 IMPLANT

## 2017-04-26 NOTE — Transfer of Care (Signed)
Immediate Anesthesia Transfer of Care Note  Patient: Tyler Maxwell  Procedure(s) Performed: Procedure(s): FOREIGN BODY REMOVAL LEFT HAND (Left)  Patient Location: PACU  Anesthesia Type:General  Level of Consciousness:  sedated, patient cooperative and responds to stimulation  Airway & Oxygen Therapy:Patient Spontanous Breathing and Patient connected to face mask oxgen  Post-op Assessment:  Report given to PACU RN and Post -op Vital signs reviewed and stable  Post vital signs:  Reviewed and stable  Last Vitals:  Vitals:   04/26/17 1205 04/26/17 1333  BP: 132/83 102/73  Pulse: 86 79  Resp: 20 11  Temp: 36.6 C   SpO2: 100% 100%    Complications: No apparent anesthesia complications

## 2017-04-26 NOTE — Anesthesia Procedure Notes (Signed)
Procedure Name: LMA Insertion Date/Time: 04/26/2017 12:53 PM Performed by: Jhonnie Garner Pre-anesthesia Checklist: Patient identified, Emergency Drugs available, Suction available and Patient being monitored Patient Re-evaluated:Patient Re-evaluated prior to induction Oxygen Delivery Method: Circle system utilized Preoxygenation: Pre-oxygenation with 100% oxygen Induction Type: IV induction Ventilation: Mask ventilation without difficulty LMA: LMA inserted LMA Size: 4.0 Number of attempts: 1 Placement Confirmation: positive ETCO2 and breath sounds checked- equal and bilateral Tube secured with: Tape Dental Injury: Teeth and Oropharynx as per pre-operative assessment

## 2017-04-26 NOTE — Anesthesia Postprocedure Evaluation (Signed)
Anesthesia Post Note  Patient: Tyler Maxwell  Procedure(s) Performed: Procedure(s) (LRB): FOREIGN BODY REMOVAL LEFT HAND (Left)     Patient location during evaluation: PACU Anesthesia Type: General Level of consciousness: awake and alert Pain management: pain level controlled Vital Signs Assessment: post-procedure vital signs reviewed and stable Respiratory status: spontaneous breathing, nonlabored ventilation and respiratory function stable Cardiovascular status: blood pressure returned to baseline and stable Postop Assessment: no signs of nausea or vomiting Anesthetic complications: no    Last Vitals:  Vitals:   04/26/17 1400 04/26/17 1415  BP: 124/80 121/82  Pulse: 64 (!) 57  Resp: 10 11  Temp:    SpO2: 100% 100%    Last Pain:  Vitals:   04/26/17 1415  TempSrc:   PainSc: 8                  Cecile Hearing

## 2017-04-26 NOTE — Anesthesia Preprocedure Evaluation (Addendum)
Anesthesia Evaluation  Patient identified by MRN, date of birth, ID band Patient awake    Reviewed: Allergy & Precautions, NPO status , Patient's Chart, lab work & pertinent test results  Airway Mallampati: II  TM Distance: >3 FB Neck ROM: Full    Dental  (+) Dental Advisory Given, Missing, Poor Dentition   Pulmonary Current Smoker,    Pulmonary exam normal breath sounds clear to auscultation       Cardiovascular negative cardio ROS Normal cardiovascular exam Rhythm:Regular Rate:Normal     Neuro/Psych PSYCHIATRIC DISORDERS Depression negative neurological ROS     GI/Hepatic negative GI ROS, (+)     substance abuse  alcohol use,   Endo/Other  negative endocrine ROS  Renal/GU negative Renal ROS     Musculoskeletal left hand foreign body   Abdominal   Peds  Hematology negative hematology ROS (+)   Anesthesia Other Findings Day of surgery medications reviewed with the patient.  Reproductive/Obstetrics                            Anesthesia Physical Anesthesia Plan  ASA: II  Anesthesia Plan: General   Post-op Pain Management:    Induction: Intravenous  PONV Risk Score and Plan: 2 and Ondansetron and Dexamethasone  Airway Management Planned: LMA  Additional Equipment:   Intra-op Plan:   Post-operative Plan: Extubation in OR  Informed Consent: I have reviewed the patients History and Physical, chart, labs and discussed the procedure including the risks, benefits and alternatives for the proposed anesthesia with the patient or authorized representative who has indicated his/her understanding and acceptance.   Dental advisory given  Plan Discussed with: CRNA  Anesthesia Plan Comments: (Risks/benefits of general anesthesia discussed with patient including risk of damage to teeth, lips, gum, and tongue, nausea/vomiting, allergic reactions to medications, and the possibility of heart  attack, stroke and death.  All patient questions answered.  Patient wishes to proceed.)        Anesthesia Quick Evaluation

## 2017-04-26 NOTE — H&P (Signed)
    PREOPERATIVE H&P  Chief Complaint: left hand foreign body  HPI: Tyler Maxwell is a 31 y.o. male who presents for surgical treatment of left hand foreign body.  He denies any changes in medical history.  Past Medical History:  Diagnosis Date  . Depression   . No significant past medical history    History reviewed. No pertinent surgical history. Social History   Social History  . Marital status: Single    Spouse name: N/A  . Number of children: N/A  . Years of education: N/A   Social History Main Topics  . Smoking status: Current Some Day Smoker    Packs/day: 1.00    Types: Cigarettes  . Smokeless tobacco: Never Used  . Alcohol use 60.0 oz/week    120 Standard drinks or equivalent per week     Comment: 3-4 40's a day  . Drug use: No     Comment: Former cocaine user  . Sexual activity: Not Asked     Comment: unknown   Other Topics Concern  . None   Social History Narrative  . None   History reviewed. No pertinent family history. No Known Allergies Prior to Admission medications   Medication Sig Start Date End Date Taking? Authorizing Provider  cephALEXin (KEFLEX) 500 MG capsule Take 1 capsule (500 mg total) by mouth 4 (four) times daily. 04/22/17  Yes Cheron Schaumann K, PA-C  ibuprofen (ADVIL,MOTRIN) 400 MG tablet Take 400 mg by mouth every 6 (six) hours as needed.   Yes [provider]     Positive ROS: All other systems have been reviewed and were otherwise negative with the exception of those mentioned in the HPI and as above.  Physical Exam: General: Alert, no acute distress Cardiovascular: No pedal edema Respiratory: No cyanosis, no use of accessory musculature GI: abdomen soft Skin: No lesions in the area of chief complaint Neurologic: Sensation intact distally Psychiatric: Patient is competent for consent with normal mood and affect Lymphatic: no lymphedema  MUSCULOSKELETAL: exam stable  Assessment: left hand foreign body  Plan: Plan  for Procedure(s): FOREIGN BODY REMOVAL LEFT HAND  The risks benefits and alternatives were discussed with the patient including but not limited to the risks of nonoperative treatment, versus surgical intervention including infection, bleeding, nerve injury,  blood clots, cardiopulmonary complications, morbidity, mortality, among others, and they were willing to proceed.   Glee Arvin, MD   04/26/2017 12:21 PM

## 2017-04-26 NOTE — Op Note (Signed)
   Date of Surgery: 04/26/2017  INDICATIONS: Tyler Maxwell is a 31 y.o.-year-old male with a left hand retained nail;  The patient did consent to the procedure after discussion of the risks and benefits.  PREOPERATIVE DIAGNOSIS: Left hand embedded nail, deep  POSTOPERATIVE DIAGNOSIS: Same.  PROCEDURE: Removal of deep embedded nail from left hand, CPT 20680  SURGEON: N. Glee Arvin, M.D.  ASSIST: none.  ANESTHESIA:  general  IV FLUIDS AND URINE: See anesthesia.  ESTIMATED BLOOD LOSS: minimal mL.  IMPLANTS: none  DRAINS: none  COMPLICATIONS: None.  DESCRIPTION OF PROCEDURE: The patient was brought to the operating room and placed supine on the operating table.  The patient had been signed prior to the procedure and this was documented. The patient had the anesthesia placed by the anesthesiologist.  A time-out was performed to confirm that this was the correct patient, site, side and location. The patient did receive antibiotics prior to the incision and was re-dosed during the procedure as needed at indicated intervals.  A tourniquet was placed.  The patient had the operative extremity prepped and draped in the standard surgical fashion.    A 2 cm incision was made over the dorsal aspect of the thumb metacarpal. Branches of the superficial radial nerve were identified and protected. Dissection was continued down to the thenar musculature. A mini C-arm was then used to localize the nail which was embedded deep in the muscle. Blunt dissection was carried through the muscle in order to find the nail. Then nail was removed in its entirety. Fluoroscopy was used to confirm no retained foreign body afterwards. There was a scant amount of purulent drainage which was cultured. This was then thoroughly irrigated. The skin was closed with interrupted 4-0 nylon. Sterile dressings were applied. Patient tolerated the procedure well and no immediate complications.  POSTOPERATIVE PLAN: Patient will be placed  on 2 weeks of Augmentin. Follow-up in 2 weeks for suture removal.  [name], MD [facility] [phone] 1:28 PM

## 2017-04-26 NOTE — Discharge Instructions (Signed)
Postoperative instructions:  Weightbearing instructions: as tolerated  Keep your dressing and/or splint clean and dry at all times.  You can remove your dressing on post-operative day #3 and change with a dry/sterile dressing or Band-Aids as needed thereafter.    Incision instructions:  Do not soak your incision for 3 weeks after surgery.  If the incision gets wet, pat dry and do not scrub the incision.  Pain control:  You have been given a prescription to be taken as directed for post-operative pain control.  In addition, elevate the operative extremity above the heart at all times to prevent swelling and throbbing pain.  Take over-the-counter Colace, 100mg  by mouth twice a day while taking narcotic pain medications to help prevent constipation.  Follow up appointments: 1) 10-14 days for suture removal and wound check. 2) Dr. Roda Shutters as scheduled.   -------------------------------------------------------------------------------------------------------------  After Surgery Pain Control:  After your surgery, post-surgical discomfort or pain is likely. This discomfort can last several days to a few weeks. At certain times of the day your discomfort may be more intense.  Did you receive a nerve block?  A nerve block can provide pain relief for one hour to two days after your surgery. As long as the nerve block is working, you will experience little or no sensation in the area the surgeon operated on.  As the nerve block wears off, you will begin to experience pain or discomfort. It is very important that you begin taking your prescribed pain medication before the nerve block fully wears off. Treating your pain at the first sign of the block wearing off will ensure your pain is better controlled and more tolerable when full-sensation returns. Do not wait until the pain is intolerable, as the medicine will be less effective. It is better to treat pain in advance than to try and catch up.  General  Anesthesia:  If you did not receive a nerve block during your surgery, you will need to start taking your pain medication shortly after your surgery and should continue to do so as prescribed by your surgeon.  Pain Medication:  Most commonly we prescribe Vicodin and Percocet for post-operative pain. Both of these medications contain a combination of acetaminophen (Tylenol) and a narcotic to help control pain.   It takes between 30 and 45 minutes before pain medication starts to work. It is important to take your medication before your pain level gets too intense.   Nausea is a common side effect of many pain medications. You will want to eat something before taking your pain medicine to help prevent nausea.   If you are taking a prescription pain medication that contains acetaminophen, we recommend that you do not take additional over the counter acetaminophen (Tylenol).  Other pain relieving options:   Using a cold pack to ice the affected area a few times a day (15 to 20 minutes at a time) can help to relieve pain, reduce swelling and bruising.   Elevation of the affected area can also help to reduce pain and swelling.     Postoperative Anesthesia Instructions-Pediatric  Activity: Your child should rest for the remainder of the day. A responsible individual must stay with your child for 24 hours.  Meals: Your child should start with liquids and light foods such as gelatin or soup unless otherwise instructed by the physician. Progress to regular foods as tolerated. Avoid spicy, greasy, and heavy foods. If nausea and/or vomiting occur, drink only clear liquids such as apple  juice or Pedialyte until the nausea and/or vomiting subsides. Call your physician if vomiting continues.  Special Instructions/Symptoms: Your child may be drowsy for the rest of the day, although some children experience some hyperactivity a few hours after the surgery. Your child may also experience some  irritability or crying episodes due to the operative procedure and/or anesthesia. Your child's throat may feel dry or sore from the anesthesia or the breathing tube placed in the throat during surgery. Use throat lozenges, sprays, or ice chips if needed.

## 2017-04-27 ENCOUNTER — Encounter (HOSPITAL_BASED_OUTPATIENT_CLINIC_OR_DEPARTMENT_OTHER): Payer: Self-pay | Admitting: Orthopaedic Surgery

## 2017-05-01 LAB — AEROBIC/ANAEROBIC CULTURE W GRAM STAIN (SURGICAL/DEEP WOUND): Culture: NO GROWTH

## 2017-05-01 LAB — AEROBIC/ANAEROBIC CULTURE (SURGICAL/DEEP WOUND)

## 2017-05-02 ENCOUNTER — Ambulatory Visit (HOSPITAL_COMMUNITY)
Admission: RE | Admit: 2017-05-02 | Discharge: 2017-05-02 | Disposition: A | Discharge status: Home / Self Care | Payer: Self-pay | Attending: Psychiatry | Admitting: Psychiatry

## 2017-05-02 ENCOUNTER — Encounter (HOSPITAL_COMMUNITY): Payer: Self-pay | Admitting: Behavioral Health

## 2017-05-02 NOTE — H&P (Signed)
Behavioral Health Medical Screening Exam  Tyler Maxwell is an 31 y.o. male.  Total Time spent with patient: 20 minutes patient present as a walk-in. Patient is requesting help to detox for alcohol. Patient currently denies suicidal or homicidal ideations. Reports recent relaps with 4 years sober. Support, encouragement and Reassurances was provided.  Psychiatric Specialty Exam: Physical Exam  Review of Systems  Psychiatric/Behavioral: Positive for substance abuse. Negative for depression, hallucinations (report intermittent spots. states nevermind you wouldnt understand and didnt elborate with discussion) and suicidal ideas. The patient is not nervous/anxious.     There were no vitals taken for this visit.There is no height or weight on file to calculate BMI.  General Appearance: Casual and Guarded malodorous to EToH    Eye Contact:  Good  Speech:  Clear and Coherent  Volume:  Normal  Mood:  Anxious reports mild depression  Affect:  Congruent  Thought Process:  Coherent  Orientation:  Full (Time, Place, and Person)  Thought Content:  Hallucinations: None  Suicidal Thoughts:  No  Homicidal Thoughts:  No  Memory:  Immediate;   Fair Recent;   Fair Remote;   Fair  Judgement:  Fair  Insight:  Present  Psychomotor Activity:  Normal  Concentration: Concentration: Good  Recall:  Good  Fund of Knowledge:Good  Language: Good  Akathisia:  No  Handed:  Right  AIMS (if indicated):     Assets:  Communication Skills Desire for Improvement Social Support  Sleep:       Musculoskeletal: Strength & Muscle Tone: within normal limits Gait & Station: normal steady gait Patient leans: N/A  There were no vitals taken for this visit. 115/80 HR 88 RR 18 Temp 97.5 sat 99 RM Recommendations:  Based on my evaluation the patient does not appear to have an emergency medical condition.  -TT[facility] to provided additional [facility]  Tyler Rackanika N Briany Aye, NP 05/02/2017, 4:34 PM

## 2017-05-02 NOTE — BH Assessment (Signed)
Assessment Note  Tyler Maxwell is a 31 y.o. male who presented to [facility] as a voluntary walk-in with complaint of alcohol use dependency and current alcohol intoxication.  Pt requested detox services.  Pt was last assessed by TTS in July 2018.  At that time, he presented to [facility] with complaint of double vision and elevated BAL.  His fiancee reported that he had made passive suicidal ideation statements.  As he wanted to leave, the EDP put him under IVC.  Pt was assessed by TTS and discharged from IVC and the hospital.  Today Pt presented with complaint that he relapsed on alcohol three months ago and is now seeking detox services.  Pt reported that he consumes 3-4 40 oz beers per day.  Last use was 20 minutes before he entered [facility] for assessment.  Pt had the odor of alcohol around him.  In addition, Pt endorsed depressive symptoms -- despondency and irritability.  Pt also endorsed visual hallucination -- little dots.  Pt denied suicidal ideation, auditory hallucination, self-injurious behavior.  During assessment, Pt presented as alert and oriented.  He had good eye contact and was cooperative.  Pt was dressed in street clothes, and he appeared appropriately groomed.  As indicated above, he had the odor of alcohol around him.  Pt endorsed daily use of alcohol and depressive symptoms.  Speech was normal in rate, rhythm, and volume.  Thought processes were within normal range, and thought content was logical and goal-oriented.  There was no evidence of delusion.  Pt's memory and concentration were intact.  Pt's impulse control, judgment, and insight were fair.  Consulted with Chilton Greathouse, NP who determined that Pt does not meet inpatient criteria.  Provided Pt resources for outpatient treatment of alcohol use.  Diagnosis: Alcohol Use Disorder; Substance-Induced Mood Disorder  Past Medical History:  Past Medical History:  Diagnosis Date  . Depression   . No significant past medical history     Past  Surgical History:  Procedure Laterality Date  . FOREIGN BODY REMOVAL Left 04/26/2017   Procedure: FOREIGN BODY REMOVAL LEFT HAND;  Surgeon: Tarry Kos, MD;  Location:  SURGERY CENTER;  Service: Orthopedics;  Laterality: Left;    Family History: No family history on file.  Social History:  reports that he has been smoking Cigarettes.  He has been smoking about 1.00 pack per day. He has never used smokeless tobacco. He reports that he drinks about 60.0 oz of alcohol per week . He reports that he does not use drugs.  Additional Social History:  Alcohol / Drug Use Pain Medications: See MAR Prescriptions: See MAR Over the Counter: See MAR History of alcohol / drug use?: Yes Substance #1 Name of Substance 1: Alcohol 1 - Amount (size/oz): 3-4 40s 1 - Frequency: Daily 1 - Duration: Ongoing 1 - Last Use / Amount: 05/02/17  CIWA:   COWS:    Allergies: No Known Allergies  Home Medications:  (Not in a hospital admission)  OB/GYN Status:  No LMP for male patient.  General Assessment Data Location of Assessment: [facility] Assessment Services TTS Assessment: In system Is this a Tele or Face-to-Face Assessment?: Face-to-Face Is this an Initial Assessment or a Re-assessment for this encounter?: Initial Assessment Marital status: Long term relationship Is patient pregnant?: No Pregnancy Status: No Living Arrangements: Spouse/significant other, Children Can pt return to current living arrangement?: Yes Admission Status: Voluntary Is patient capable of signing voluntary admission?: Yes Referral Source: Self/Family/Friend Insurance type: SP  Medical Screening  Exam [facility] Walk-in ONLY) Medical Exam completed: Yes  Crisis Care Plan Living Arrangements: Spouse/significant other, Children Name of Psychiatrist: None currenlty Name of Therapist: None currently  Education Status Is patient currently in school?: No  Risk to self with the past 6 months Suicidal Ideation: No (per  report, Pt may have had passive SI last month) Has patient been a risk to self within the past 6 months prior to admission? : No Suicidal Intent: No Has patient had any suicidal intent within the past 6 months prior to admission? : No Is patient at risk for suicide?: No Suicidal Plan?: No Has patient had any suicidal plan within the past 6 months prior to admission? : No Access to Means: No What has been your use of drugs/alcohol within the last 12 months?: Alcohol (past use of cocaine, marijuana) Previous Attempts/Gestures: No Triggers for Past Attempts: Other (Comment) (Substance use) Intentional Self Injurious Behavior: None Family Suicide History: No Recent stressful life event(s): Other (Comment) (Relapse on alcohol) Persecutory voices/beliefs?: No Depression: Yes Depression Symptoms: Despondent, Feeling angry/irritable Substance abuse history and/or treatment for substance abuse?: Yes Suicide prevention information given to non-admitted patients: Not applicable  Risk to Others within the past 6 months Homicidal Ideation: No Does patient have any lifetime risk of violence toward others beyond the six months prior to admission? : No Thoughts of Harm to Others: No Current Homicidal Intent: No Current Homicidal Plan: No Access to Homicidal Means: No History of harm to others?: No Assessment of Violence: None Noted Does patient have access to weapons?: No Criminal Charges Pending?: No Does patient have a court date: No Is patient on probation?: Unknown  Psychosis Hallucinations: None noted Delusions: None noted  Mental Status Report Appearance/Hygiene: Unremarkable, Other (Comment), Body odor (Strong smell of alcohol) Eye Contact: Good Motor Activity: Freedom of movement, Unremarkable Speech: Tangential Level of Consciousness: Alert Mood: Preoccupied Affect: Blunted Anxiety Level: None Thought Processes: Circumstantial Judgement: Partial Orientation: Person, Time,  Situation, Place Obsessive Compulsive Thoughts/Behaviors: None  Cognitive Functioning Concentration: Fair Memory: Remote Intact, Recent Intact IQ: Average Insight: Fair Impulse Control: Poor Appetite: Fair Sleep: No Change Vegetative Symptoms: None  ADLScreening [facility] Assessment Services) Patient's cognitive ability adequate to safely complete daily activities?: Yes Patient able to express need for assistance with ADLs?: Yes Independently performs ADLs?: Yes (appropriate for developmental age)  Prior Inpatient Therapy Prior Inpatient Therapy: Yes Prior Therapy Dates: 2014,2015 [facility] Reason for Treatment: Depression, SA  Prior Outpatient Therapy Prior Outpatient Therapy: No Does patient have an ACCT team?: No Does patient have Intensive In-House Services?  : No Does patient have [facility] services? : No Does patient have P4CC services?: No  ADL Screening (condition at time of admission) Patient's cognitive ability adequate to safely complete daily activities?: Yes Is the patient deaf or have difficulty hearing?: No Does the patient have difficulty seeing, even when wearing glasses/contacts?: No Does the patient have difficulty concentrating, remembering, or making decisions?: No Patient able to express need for assistance with ADLs?: Yes Does the patient have difficulty dressing or bathing?: No Independently performs ADLs?: Yes (appropriate for developmental age) Does the patient have difficulty walking or climbing stairs?: No Weakness of Legs: None Weakness of Arms/Hands: None  Home Assistive Devices/Equipment Home Assistive Devices/Equipment: None  Therapy Consults (therapy consults require a physician order) PT Evaluation Needed: No OT Evalulation Needed: No SLP Evaluation Needed: No Abuse/Neglect Assessment (Assessment to be complete while patient is alone) Physical Abuse: Denies Verbal Abuse: Denies Sexual Abuse:  Denies Exploitation of patient/patient's resources: Denies Self-Neglect: Denies Values / Beliefs Cultural Requests During Hospitalization: None Spiritual Requests During Hospitalization: None Consults Spiritual Care Consult Needed: No Social Work Consult Needed: No Merchant navy officer (For Healthcare) Does Patient Have a Medical Advance Directive?: No    Additional Information 1:1 In Past 12 Months?: No CIRT Risk: No Elopement Risk: No Does patient have medical clearance?: Yes     Disposition:  Disposition Initial Assessment Completed for this Encounter: Yes Disposition of Patient: Outpatient treatment Type of outpatient treatment: Adult, Chemical Dependence - Intensive Outpatient (Recommended o/p; provided o/p resources)  On Site Evaluation by:   Reviewed with Physician:    Dorris Fetch Serayah Yazdani 05/02/2017 4:59 PM

## 2017-05-10 ENCOUNTER — Ambulatory Visit (INDEPENDENT_AMBULATORY_CARE_PROVIDER_SITE_OTHER): Payer: Worker's Compensation | Admitting: Orthopaedic Surgery

## 2017-05-10 DIAGNOSIS — W294XXA Contact with nail gun, initial encounter: Secondary | ICD-10-CM

## 2017-05-10 DIAGNOSIS — S6990XA Unspecified injury of unspecified wrist, hand and finger(s), initial encounter: Secondary | ICD-10-CM

## 2017-05-10 MED ORDER — HYDROCODONE-ACETAMINOPHEN 5-325 MG PO TABS: 1.0000 | ORAL_TABLET | Freq: Every day | ORAL | 0 refills | Status: DC | PRN

## 2017-05-10 NOTE — Progress Notes (Signed)
Patient is 2 weeks status post nail removal from his left hand. He is doing well. Denies any significant pain.  Incision is clean dry and intact and healed. No signs of infection. Full use of the hand and thumb.  Sutures were removed today. Return to work note given. One-week supply of Norco was given today. Follow-up as needed.

## 2017-06-06 ENCOUNTER — Telehealth (INDEPENDENT_AMBULATORY_CARE_PROVIDER_SITE_OTHER): Payer: Self-pay | Admitting: Orthopaedic Surgery

## 2017-06-06 NOTE — Telephone Encounter (Signed)
Patient called asking if a letter could be sent to his employer stating when he will be released to go back to work. Fax #

## 2017-06-06 NOTE — Telephone Encounter (Signed)
Patient called asked if Dr. Roda Shutters will fax a note to his attorney stating when he can return to work. The attorney's name is Laurel Dimmer with  [employer]   Fax# [phone]   Ph# [phone]  The number to contact patient is [phone]

## 2017-06-06 NOTE — Telephone Encounter (Signed)
Patient stated that he needs a return to work note with a return to work date to be faxed to [phone].  CB# is [phone].  Thank You.

## 2017-06-07 NOTE — Telephone Encounter (Signed)
now

## 2017-06-07 NOTE — Telephone Encounter (Signed)
Please advise 

## 2017-06-07 NOTE — Telephone Encounter (Signed)
PREVIOUS NOTE FAXED TO  Laurel Dimmer with  [employer] FAX # [phone]

## 2017-06-08 ENCOUNTER — Telehealth (INDEPENDENT_AMBULATORY_CARE_PROVIDER_SITE_OTHER): Payer: Self-pay | Admitting: Orthopaedic Surgery

## 2017-06-08 NOTE — Telephone Encounter (Signed)
*  Urgent* Pt needs paper work to be signed(FLMA)

## 2017-06-08 NOTE — Telephone Encounter (Signed)
IC advised patient Dr Roda Shutters and assistant out of office until Monday.

## 2017-06-11 NOTE — Telephone Encounter (Signed)
Not sure about this. Do you have form? I do not have any that needs signature.

## 2017-06-16 ENCOUNTER — Other Ambulatory Visit (HOSPITAL_COMMUNITY): Payer: Self-pay

## 2017-06-16 ENCOUNTER — Encounter (HOSPITAL_COMMUNITY): Payer: Self-pay

## 2017-06-16 ENCOUNTER — Ambulatory Visit (HOSPITAL_COMMUNITY)
Admission: EM | Admit: 2017-06-16 | Discharge: 2017-06-17 | Disposition: A | Discharge status: Home / Self Care | Payer: Self-pay | Attending: Emergency Medicine | Admitting: Emergency Medicine

## 2017-06-16 DIAGNOSIS — F10129 Alcohol abuse with intoxication, unspecified: Secondary | ICD-10-CM | POA: Insufficient documentation

## 2017-06-16 DIAGNOSIS — S61351A Open bite of left index finger with damage to nail, initial encounter: Secondary | ICD-10-CM | POA: Insufficient documentation

## 2017-06-16 DIAGNOSIS — Z23 Encounter for immunization: Secondary | ICD-10-CM | POA: Insufficient documentation

## 2017-06-16 DIAGNOSIS — F17210 Nicotine dependence, cigarettes, uncomplicated: Secondary | ICD-10-CM | POA: Insufficient documentation

## 2017-06-16 DIAGNOSIS — Z791 Long term (current) use of non-steroidal anti-inflammatories (NSAID): Secondary | ICD-10-CM | POA: Insufficient documentation

## 2017-06-16 DIAGNOSIS — F10920 Alcohol use, unspecified with intoxication, uncomplicated: Secondary | ICD-10-CM

## 2017-06-16 DIAGNOSIS — Z79899 Other long term (current) drug therapy: Secondary | ICD-10-CM | POA: Insufficient documentation

## 2017-06-16 DIAGNOSIS — T148XXA Other injury of unspecified body region, initial encounter: Secondary | ICD-10-CM

## 2017-06-16 MED ORDER — TETANUS-DIPHTH-ACELL PERTUSSIS 5-2.5-18.5 LF-MCG/0.5 IM SUSP
0.5000 mL | Freq: Once | INTRAMUSCULAR | Status: AC
  Administered 2017-06-17: 0.5 mL via INTRAMUSCULAR
  Filled 2017-06-16: qty 0.5

## 2017-06-16 MED ORDER — SODIUM CHLORIDE 0.9 % IV SOLN
3.0000 g | Freq: Once | INTRAVENOUS | Status: AC
  Administered 2017-06-17: 3 g via INTRAVENOUS
  Filled 2017-06-16: qty 3

## 2017-06-16 MED ORDER — SODIUM CHLORIDE 0.9 % IV BOLUS (SEPSIS)
1000.0000 mL | Freq: Once | INTRAVENOUS | Status: AC
  Administered 2017-06-17: 1000 mL via INTRAVENOUS
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
## 2017-06-17 ENCOUNTER — Encounter (HOSPITAL_COMMUNITY): Payer: Self-pay | Admitting: Anesthesiology

## 2017-06-17 ENCOUNTER — Other Ambulatory Visit (HOSPITAL_COMMUNITY): Payer: Self-pay

## 2017-06-17 ENCOUNTER — Emergency Department (HOSPITAL_COMMUNITY): Payer: Self-pay

## 2017-06-17 ENCOUNTER — Emergency Department (HOSPITAL_COMMUNITY): Payer: Self-pay | Admitting: Anesthesiology

## 2017-06-17 ENCOUNTER — Encounter (HOSPITAL_COMMUNITY)
Admission: EM | Disposition: A | Discharge status: Home / Self Care | Payer: Self-pay | Source: Home / Self Care | Attending: Emergency Medicine

## 2017-06-17 HISTORY — PX: I & D EXTREMITY: SHX5045

## 2017-06-17 LAB — CBC WITH DIFFERENTIAL/PLATELET
BASOS ABS: 0 10*3/uL (ref 0.0–0.1)
Basophils Relative: 1 %
Eosinophils Absolute: 0.1 10*3/uL (ref 0.0–0.7)
Eosinophils Relative: 1 %
HEMATOCRIT: 43.9 % (ref 39.0–52.0)
HEMOGLOBIN: 15.3 g/dL (ref 13.0–17.0)
LYMPHS PCT: 22 %
Lymphs Abs: 1.2 10*3/uL (ref 0.7–4.0)
MCH: 31.7 pg (ref 26.0–34.0)
MCHC: 34.9 g/dL (ref 30.0–36.0)
MCV: 90.9 fL (ref 78.0–100.0)
MONO ABS: 0.5 10*3/uL (ref 0.1–1.0)
Monocytes Relative: 10 %
NEUTROS ABS: 3.6 10*3/uL (ref 1.7–7.7)
Neutrophils Relative %: 66 %
PLATELETS: 252 10*3/uL (ref 150–400)
RBC: 4.83 MIL/uL (ref 4.22–5.81)
RDW: 14.5 % (ref 11.5–15.5)
WBC: 5.5 10*3/uL (ref 4.0–10.5)

## 2017-06-17 LAB — BASIC METABOLIC PANEL
ANION GAP: 13 (ref 5–15)
BUN: 6 mg/dL (ref 6–20)
CALCIUM: 9 mg/dL (ref 8.9–10.3)
CO2: 21 mmol/L — ABNORMAL LOW (ref 22–32)
CREATININE: 0.81 mg/dL (ref 0.61–1.24)
Chloride: 103 mmol/L (ref 101–111)
Glucose, Bld: 92 mg/dL (ref 65–99)
Potassium: 4.1 mmol/L (ref 3.5–5.1)
SODIUM: 137 mmol/L (ref 135–145)

## 2017-06-17 LAB — ETHANOL: ALCOHOL ETHYL (B): 350 mg/dL — AB (ref <10)

## 2017-06-17 SURGERY — IRRIGATION AND DEBRIDEMENT EXTREMITY
Anesthesia: General | Site: Finger | Laterality: Left

## 2017-06-17 MED ORDER — DEXMEDETOMIDINE HCL IN NACL 200 MCG/50ML IV SOLN
INTRAVENOUS | Status: AC
  Filled 2017-06-17: qty 50

## 2017-06-17 MED ORDER — OXYCODONE HCL 5 MG PO TABS: 5.0000 mg | ORAL_TABLET | Freq: Once | ORAL | Status: DC | PRN

## 2017-06-17 MED ORDER — PHENYLEPHRINE HCL 10 MG/ML IJ SOLN
INTRAMUSCULAR | Status: DC | PRN
  Administered 2017-06-17 (×2): 80 ug via INTRAVENOUS

## 2017-06-17 MED ORDER — OXYCODONE HCL 5 MG/5ML PO SOLN
5.0000 mg | Freq: Once | ORAL | Status: DC | PRN
Start: 2017-06-17 — End: 2017-06-21

## 2017-06-17 MED ORDER — LIDOCAINE HCL (CARDIAC) 20 MG/ML IV SOLN
INTRAVENOUS | Status: DC | PRN
  Administered 2017-06-17: 50 mg via INTRAVENOUS

## 2017-06-17 MED ORDER — BUPIVACAINE HCL (PF) 0.25 % IJ SOLN
INTRAMUSCULAR | Status: AC
  Filled 2017-06-17: qty 30

## 2017-06-17 MED ORDER — SUCCINYLCHOLINE CHLORIDE 20 MG/ML IJ SOLN
INTRAMUSCULAR | Status: DC | PRN
  Administered 2017-06-17: 260 mg via INTRAVENOUS

## 2017-06-17 MED ORDER — ONDANSETRON HCL 4 MG/2ML IJ SOLN
INTRAMUSCULAR | Status: DC | PRN
  Administered 2017-06-17: 4 mg via INTRAVENOUS

## 2017-06-17 MED ORDER — LIDOCAINE 2% (20 MG/ML) 5 ML SYRINGE
INTRAMUSCULAR | Status: AC
  Filled 2017-06-17: qty 5

## 2017-06-17 MED ORDER — HYDROMORPHONE HCL 1 MG/ML IJ SOLN: 0.2500 mg | INTRAMUSCULAR | Status: DC | PRN

## 2017-06-17 MED ORDER — ONDANSETRON HCL 4 MG/2ML IJ SOLN: 4.0000 mg | Freq: Once | INTRAMUSCULAR | Status: DC | PRN

## 2017-06-17 MED ORDER — AMOXICILLIN-POT CLAVULANATE 875-125 MG PO TABS: 1.0000 | ORAL_TABLET | Freq: Two times a day (BID) | ORAL | 0 refills | Status: DC

## 2017-06-17 MED ORDER — DEXAMETHASONE SODIUM PHOSPHATE 10 MG/ML IJ SOLN
INTRAMUSCULAR | Status: AC
  Filled 2017-06-17: qty 1

## 2017-06-17 MED ORDER — DEXAMETHASONE SODIUM PHOSPHATE 10 MG/ML IJ SOLN
INTRAMUSCULAR | Status: DC | PRN
  Administered 2017-06-17: 5 mg via INTRAVENOUS

## 2017-06-17 MED ORDER — PROPOFOL 10 MG/ML IV BOLUS
INTRAVENOUS | Status: DC | PRN
  Administered 2017-06-17: 200 mg via INTRAVENOUS

## 2017-06-17 MED ORDER — FENTANYL CITRATE (PF) 100 MCG/2ML IJ SOLN
INTRAMUSCULAR | Status: DC | PRN
  Administered 2017-06-17: 75 ug via INTRAVENOUS
  Administered 2017-06-17: 50 ug via INTRAVENOUS

## 2017-06-17 MED ORDER — PROPOFOL 10 MG/ML IV BOLUS
INTRAVENOUS | Status: AC
  Filled 2017-06-17: qty 20

## 2017-06-17 MED ORDER — LACTATED RINGERS IV SOLN
INTRAVENOUS | Status: DC | PRN
  Administered 2017-06-17 (×2): via INTRAVENOUS

## 2017-06-17 MED ORDER — BUPIVACAINE HCL (PF) 0.25 % IJ SOLN
INTRAMUSCULAR | Status: DC | PRN
  Administered 2017-06-17: 10 mL

## 2017-06-17 MED ORDER — FENTANYL CITRATE (PF) 250 MCG/5ML IJ SOLN
INTRAMUSCULAR | Status: AC
  Filled 2017-06-17: qty 5

## 2017-06-17 MED ORDER — PHENYLEPHRINE 40 MCG/ML (10ML) SYRINGE FOR IV PUSH (FOR BLOOD PRESSURE SUPPORT)
PREFILLED_SYRINGE | INTRAVENOUS | Status: AC
  Filled 2017-06-17: qty 10

## 2017-06-17 MED ORDER — HYDROCODONE-ACETAMINOPHEN 5-325 MG PO TABS: ORAL_TABLET | ORAL | 0 refills | Status: DC

## 2017-06-17 MED ORDER — SUCCINYLCHOLINE CHLORIDE 200 MG/10ML IV SOSY
PREFILLED_SYRINGE | INTRAVENOUS | Status: AC
  Filled 2017-06-17: qty 10

## 2017-06-17 MED ORDER — 0.9 % SODIUM CHLORIDE (POUR BTL) OPTIME
TOPICAL | Status: DC | PRN
  Administered 2017-06-17: 1000 mL

## 2017-06-17 MED ORDER — ONDANSETRON HCL 4 MG/2ML IJ SOLN
INTRAMUSCULAR | Status: AC
  Filled 2017-06-17: qty 2

## 2017-06-17 SURGICAL SUPPLY — 52 items
BANDAGE ACE 3X5.8 VEL STRL LF (GAUZE/BANDAGES/DRESSINGS) ×3 IMPLANT
BANDAGE ACE 4X5 VEL STRL LF (GAUZE/BANDAGES/DRESSINGS) IMPLANT
BANDAGE COBAN STERILE 2 (GAUZE/BANDAGES/DRESSINGS) IMPLANT
BNDG COHESIVE 1X5 TAN STRL LF (GAUZE/BANDAGES/DRESSINGS) ×3 IMPLANT
BNDG ESMARK 4X9 LF (GAUZE/BANDAGES/DRESSINGS) ×3 IMPLANT
BNDG GAUZE ELAST 4 BULKY (GAUZE/BANDAGES/DRESSINGS) ×3 IMPLANT
CORDS BIPOLAR (ELECTRODE) ×3 IMPLANT
COVER SURGICAL LIGHT HANDLE (MISCELLANEOUS) ×3 IMPLANT
CUFF TOURNIQUET SINGLE 18IN (TOURNIQUET CUFF) ×3 IMPLANT
CUFF TOURNIQUET SINGLE 24IN (TOURNIQUET CUFF) IMPLANT
DECANTER SPIKE VIAL GLASS SM (MISCELLANEOUS) ×3 IMPLANT
DRAIN PENROSE 1/4X12 LTX STRL (WOUND CARE) IMPLANT
DRSG PAD ABDOMINAL 8X10 ST (GAUZE/BANDAGES/DRESSINGS) ×6 IMPLANT
GAUZE SPONGE 4X4 12PLY STRL (GAUZE/BANDAGES/DRESSINGS) ×3 IMPLANT
GAUZE XEROFORM 1X8 LF (GAUZE/BANDAGES/DRESSINGS) ×3 IMPLANT
GLOVE BIO SURGEON STRL SZ7.5 (GLOVE) ×6 IMPLANT
GLOVE BIOGEL PI IND STRL 7.0 (GLOVE) ×1 IMPLANT
GLOVE BIOGEL PI IND STRL 8 (GLOVE) ×2 IMPLANT
GLOVE BIOGEL PI INDICATOR 7.0 (GLOVE) ×2
GLOVE BIOGEL PI INDICATOR 8 (GLOVE) ×4
GLOVE INDICATOR 7.0 STRL GRN (GLOVE) ×3 IMPLANT
GOWN STRL REUS W/ TWL LRG LVL3 (GOWN DISPOSABLE) ×1 IMPLANT
GOWN STRL REUS W/ TWL XL LVL3 (GOWN DISPOSABLE) ×1 IMPLANT
GOWN STRL REUS W/TWL LRG LVL3 (GOWN DISPOSABLE) ×2
GOWN STRL REUS W/TWL XL LVL3 (GOWN DISPOSABLE) ×2
KIT BASIN OR (CUSTOM PROCEDURE TRAY) ×3 IMPLANT
KIT ROOM TURNOVER OR (KITS) ×3 IMPLANT
LOOP VESSEL MAXI BLUE (MISCELLANEOUS) IMPLANT
LOOP VESSEL MINI RED (MISCELLANEOUS) ×3 IMPLANT
MANIFOLD NEPTUNE II (INSTRUMENTS) IMPLANT
NEEDLE HYPO 25X1 1.5 SAFETY (NEEDLE) IMPLANT
NS IRRIG 1000ML POUR BTL (IV SOLUTION) ×3 IMPLANT
PACK ORTHO EXTREMITY (CUSTOM PROCEDURE TRAY) ×3 IMPLANT
PAD ARMBOARD 7.5X6 YLW CONV (MISCELLANEOUS) ×6 IMPLANT
SCRUB BETADINE 4OZ XXX (MISCELLANEOUS) ×3 IMPLANT
SET CYSTO W/LG BORE CLAMP LF (SET/KITS/TRAYS/PACK) ×3 IMPLANT
SOL PREP POV-IOD 4OZ 10% (MISCELLANEOUS) ×3 IMPLANT
SPLINT FINGER (SOFTGOODS) ×3 IMPLANT
SPONGE LAP 4X18 X RAY DECT (DISPOSABLE) ×3 IMPLANT
SUT CHROMIC 6 0 PS 4 (SUTURE) ×3 IMPLANT
SUT ETHILON 4 0 P 3 18 (SUTURE) IMPLANT
SUT ETHILON 4 0 PS 2 18 (SUTURE) IMPLANT
SUT MON AB 5-0 P3 18 (SUTURE) ×3 IMPLANT
SWAB COLLECTION DEVICE MRSA (MISCELLANEOUS) IMPLANT
SWAB CULTURE ESWAB REG 1ML (MISCELLANEOUS) IMPLANT
SYR CONTROL 10ML LL (SYRINGE) IMPLANT
TOWEL OR 17X26 10 PK STRL BLUE (TOWEL DISPOSABLE) ×3 IMPLANT
TUBE CONNECTING 12'X1/4 (SUCTIONS) ×1
TUBE CONNECTING 12X1/4 (SUCTIONS) ×2 IMPLANT
TUBE FEEDING ENTERAL 5FR 16IN (TUBING) IMPLANT
UNDERPAD 30X30 (UNDERPADS AND DIAPERS) ×3 IMPLANT
YANKAUER SUCT BULB TIP NO VENT (SUCTIONS) ×3 IMPLANT
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
## 2017-06-17 NOTE — Anesthesia Procedure Notes (Signed)
Procedure Name: Intubation Date/Time: 06/17/2017 1:53 AM Performed by: Edmonia Caprio Pre-anesthesia Checklist: Patient identified, Emergency Drugs available, Suction available, Patient being monitored and Timeout performed Patient Re-evaluated:Patient Re-evaluated prior to induction Oxygen Delivery Method: Circle system utilized Preoxygenation: Pre-oxygenation with 100% oxygen Induction Type: IV induction Laryngoscope Size: Miller and 2 Grade View: Grade I Tube type: Oral Tube size: 7.5 mm Number of attempts: 1 Airway Equipment and Method: Stylet Placement Confirmation: ETT inserted through vocal cords under direct vision,  positive ETCO2 and breath sounds checked- equal and bilateral Secured at: 22 cm Tube secured with: Tape Dental Injury: Teeth and Oropharynx as per pre-operative assessment
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
## 2017-06-17 NOTE — Transfer of Care (Signed)
Immediate Anesthesia Transfer of Care Note  Patient: Tyler Maxwell  Procedure(s) Performed: IRRIGATION AND DEBRIDEMENT EXTREMITY (Left Finger)  Patient Location: PACU  Anesthesia Type:General  Level of Consciousness: sedated  Airway & Oxygen Therapy: Patient Spontanous Breathing  Post-op Assessment: Report given to RN and Post -op Vital signs reviewed and stable  Post vital signs: Reviewed and stable  Last Vitals:  Vitals:   06/16/17 2342  BP: 106/83  Pulse: 98  Resp: 20  Temp: 36.8 C  SpO2: 99%    Last Pain:  Vitals:   06/16/17 2345  TempSrc:   PainSc: 10-Worst pain ever         Complications: No apparent anesthesia complications

## 2017-06-17 NOTE — Anesthesia Preprocedure Evaluation (Addendum)
Anesthesia Evaluation  Patient identified by MRN, date of birth, ID band Patient awake    Reviewed: Allergy & Precautions, NPO status , Patient's Chart, lab work & pertinent test results  Airway Mallampati: II  TM Distance: >3 FB Neck ROM: Full    Dental  (+) Poor Dentition,    Pulmonary Current Smoker,    breath sounds clear to auscultation       Cardiovascular  Rhythm:Regular Rate:Normal     Neuro/Psych    GI/Hepatic   Endo/Other    Renal/GU      Musculoskeletal   Abdominal   Peds  Hematology   Anesthesia Other Findings   Reproductive/Obstetrics                            Anesthesia Physical Anesthesia Plan  ASA: II  Anesthesia Plan: General   Post-op Pain Management:    Induction: Rapid sequence and Cricoid pressure planned  PONV Risk Score and Plan: 1 and Ondansetron and Dexamethasone  Airway Management Planned: Oral ETT  Additional Equipment:   Intra-op Plan:   Post-operative Plan: Extubation in OR  Informed Consent: I have reviewed the patients History and Physical, chart, labs and discussed the procedure including the risks, benefits and alternatives for the proposed anesthesia with the patient or authorized representative who has indicated his/her understanding and acceptance.   Dental advisory given  Plan Discussed with: Anesthesiologist and CRNA  Anesthesia Plan Comments:         Anesthesia Quick Evaluation

## 2017-06-17 NOTE — Progress Notes (Signed)
Patient denies headache, chest pain and hand pain. Dressed and able to ambulate without assistance. To bathroom to void . Home instructions given to pt and fiance. Tolerating fluids . Verbalized understanding  importance of starting antibiotics this am . Discharged home at [phone]

## 2017-06-17 NOTE — ED Notes (Signed)
Patient transported to CT 
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
## 2017-06-18 ENCOUNTER — Encounter (HOSPITAL_COMMUNITY): Payer: Self-pay | Admitting: Orthopedic Surgery

## 2017-06-18 NOTE — Anesthesia Postprocedure Evaluation (Signed)
Anesthesia Post Note  Patient: Tyler Maxwell  Procedure(s) Performed: IRRIGATION AND DEBRIDEMENT EXTREMITY (Left Finger)     Patient location during evaluation: PACU Anesthesia Type: General Level of consciousness: awake, awake and alert and oriented Pain management: pain level controlled Vital Signs Assessment: post-procedure vital signs reviewed and stable Respiratory status: spontaneous breathing, nonlabored ventilation and respiratory function stable Cardiovascular status: blood pressure returned to baseline Anesthetic complications: no    Last Vitals:  Vitals:   06/17/17 0330 06/17/17 0345  BP: (!) 101/56 116/77  Pulse:    Resp:    Temp:  36.5 C  SpO2:      Last Pain:  Vitals:   06/17/17 0345  TempSrc:   PainSc: 0-No pain                 Melesa Lecy COKER
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
## 2020-09-13 ENCOUNTER — Ambulatory Visit (HOSPITAL_COMMUNITY): Payer: Self-pay

## 2020-10-25 ENCOUNTER — Ambulatory Visit (HOSPITAL_COMMUNITY)
Admission: EM | Admit: 2020-10-25 | Discharge: 2020-10-25 | Disposition: A | Discharge status: Home / Self Care | Payer: 59 | Attending: Student | Admitting: Student

## 2020-10-25 ENCOUNTER — Encounter (HOSPITAL_COMMUNITY): Payer: Self-pay

## 2020-10-25 ENCOUNTER — Other Ambulatory Visit: Payer: Self-pay

## 2020-10-25 DIAGNOSIS — Z8616 Personal history of COVID-19: Secondary | ICD-10-CM

## 2020-10-25 DIAGNOSIS — R112 Nausea with vomiting, unspecified: Secondary | ICD-10-CM

## 2020-10-25 MED ORDER — ONDANSETRON HCL 8 MG PO TABS: 8.0000 mg | ORAL_TABLET | Freq: Three times a day (TID) | ORAL | 0 refills | Status: DC | PRN

## 2020-10-25 NOTE — ED Triage Notes (Addendum)
Pt presents with complaints of emesis, nausea, and hot and cold sweats x 2 days. Reports having covid x 1 month ago. Denies any other symptoms. Reports generalized abdominal pain and headache.
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
## 2020-10-25 NOTE — Discharge Instructions (Addendum)
-  Take the medication- Zofran (ondansetron) as needed up to 3x daily for nausea and vomiting.  -Tylenol for fevers/chills -Make sure to drink plenty of water and eat a bland diet as tolerated -Come back and see [place] if you're not able to keep food and water down despite treatment; if you develop worsening of abdominal pain; fevers that won't reduce even with tylenol; etc .

## 2020-10-26 ENCOUNTER — Encounter (HOSPITAL_COMMUNITY): Payer: Self-pay

## 2022-05-03 ENCOUNTER — Observation Stay (HOSPITAL_COMMUNITY)
Admission: EM | Admit: 2022-05-03 | Discharge: 2022-05-05 | Disposition: A | Discharge status: Home / Self Care | Payer: Commercial Managed Care - HMO | Attending: Internal Medicine | Admitting: Internal Medicine

## 2022-05-03 ENCOUNTER — Other Ambulatory Visit: Payer: Self-pay

## 2022-05-03 ENCOUNTER — Encounter (HOSPITAL_COMMUNITY): Payer: Self-pay

## 2022-05-03 DIAGNOSIS — R5383 Other fatigue: Secondary | ICD-10-CM | POA: Diagnosis present

## 2022-05-03 DIAGNOSIS — E1010 Type 1 diabetes mellitus with ketoacidosis without coma: Secondary | ICD-10-CM | POA: Diagnosis present

## 2022-05-03 DIAGNOSIS — E1110 Type 2 diabetes mellitus with ketoacidosis without coma: Principal | ICD-10-CM | POA: Insufficient documentation

## 2022-05-03 DIAGNOSIS — Z79899 Other long term (current) drug therapy: Secondary | ICD-10-CM | POA: Diagnosis not present

## 2022-05-03 DIAGNOSIS — E119 Type 2 diabetes mellitus without complications: Secondary | ICD-10-CM

## 2022-05-03 DIAGNOSIS — E871 Hypo-osmolality and hyponatremia: Secondary | ICD-10-CM | POA: Insufficient documentation

## 2022-05-03 DIAGNOSIS — F17210 Nicotine dependence, cigarettes, uncomplicated: Secondary | ICD-10-CM | POA: Insufficient documentation

## 2022-05-03 DIAGNOSIS — E1165 Type 2 diabetes mellitus with hyperglycemia: Secondary | ICD-10-CM | POA: Diagnosis not present

## 2022-05-03 LAB — URINALYSIS, ROUTINE W REFLEX MICROSCOPIC
Bacteria, UA: NONE SEEN
Bilirubin Urine: NEGATIVE
Glucose, UA: >=500 mg/dL — AB
Hgb urine dipstick: NEGATIVE
Ketones, ur: 80 mg/dL — AB
Leukocytes,Ua: NEGATIVE
Nitrite: NEGATIVE
Protein, ur: NEGATIVE mg/dL
Specific Gravity, Urine: 1.031 — ABNORMAL HIGH (ref 1.005–1.030)
pH: 5 (ref 5.0–8.0)

## 2022-05-03 LAB — CBC WITH DIFFERENTIAL/PLATELET
Abs Immature Granulocytes: 0.01 10*3/uL (ref 0.00–0.07)
Basophils Absolute: 0 10*3/uL (ref 0.0–0.1)
Basophils Relative: 1 %
Eosinophils Absolute: 0.1 10*3/uL (ref 0.0–0.5)
Eosinophils Relative: 2 %
HCT: 43.7 % (ref 39.0–52.0)
Hemoglobin: 15.4 g/dL (ref 13.0–17.0)
Immature Granulocytes: 0 %
Lymphocytes Relative: 42 %
Lymphs Abs: 2.2 10*3/uL (ref 0.7–4.0)
MCH: 29.4 pg (ref 26.0–34.0)
MCHC: 35.2 g/dL (ref 30.0–36.0)
MCV: 83.4 fL (ref 80.0–100.0)
Monocytes Absolute: 0.5 10*3/uL (ref 0.1–1.0)
Monocytes Relative: 9 %
Neutro Abs: 2.4 10*3/uL (ref 1.7–7.7)
Neutrophils Relative %: 46 %
Platelets: 272 10*3/uL (ref 150–400)
RBC: 5.24 MIL/uL (ref 4.22–5.81)
RDW: 12.2 % (ref 11.5–15.5)
WBC: 5.1 10*3/uL (ref 4.0–10.5)
nRBC: 0 % (ref 0.0–0.2)

## 2022-05-03 LAB — BLOOD GAS, VENOUS
Acid-base deficit: 6.4 mmol/L — ABNORMAL HIGH (ref 0.0–2.0)
Bicarbonate: 20.2 mmol/L (ref 20.0–28.0)
O2 Saturation: 53.1 %
Patient temperature: 37
pCO2, Ven: 43 mmHg — ABNORMAL LOW (ref 44–60)
pH, Ven: 7.28 (ref 7.25–7.43)
pO2, Ven: <31 mmHg — CL (ref 32–45)

## 2022-05-03 LAB — COMPREHENSIVE METABOLIC PANEL
ALT: 24 U/L (ref 0–44)
AST: 18 U/L (ref 15–41)
Albumin: 4.3 g/dL (ref 3.5–5.0)
Alkaline Phosphatase: 87 U/L (ref 38–126)
Anion gap: 17 — ABNORMAL HIGH (ref 5–15)
BUN: 9 mg/dL (ref 6–20)
CO2: 17 mmol/L — ABNORMAL LOW (ref 22–32)
Calcium: 9.2 mg/dL (ref 8.9–10.3)
Chloride: 93 mmol/L — ABNORMAL LOW (ref 98–111)
Creatinine, Ser: 0.97 mg/dL (ref 0.61–1.24)
GFR, Estimated: >60 mL/min (ref >60)
Glucose, Bld: 547 mg/dL (ref 70–99)
Potassium: 4.2 mmol/L (ref 3.5–5.1)
Sodium: 127 mmol/L — ABNORMAL LOW (ref 135–145)
Total Bilirubin: 2 mg/dL — ABNORMAL HIGH (ref 0.3–1.2)
Total Protein: 7.4 g/dL (ref 6.5–8.1)

## 2022-05-03 LAB — LIPASE, BLOOD: Lipase: 38 U/L (ref 11–51)

## 2022-05-03 LAB — CBG MONITORING, ED
Glucose-Capillary: 442 mg/dL — ABNORMAL HIGH (ref 70–99)
Glucose-Capillary: 506 mg/dL (ref 70–99)
Glucose-Capillary: 566 mg/dL (ref 70–99)

## 2022-05-03 LAB — BETA-HYDROXYBUTYRIC ACID: Beta-Hydroxybutyric Acid: 6.59 mmol/L — ABNORMAL HIGH (ref 0.05–0.27)

## 2022-05-03 MED ORDER — POTASSIUM CHLORIDE 10 MEQ/100ML IV SOLN
10.0000 meq | INTRAVENOUS | Status: AC
  Administered 2022-05-03 – 2022-05-04 (×2): 10 meq via INTRAVENOUS
  Filled 2022-05-03 (×2): qty 100

## 2022-05-03 MED ORDER — INSULIN REGULAR(HUMAN) IN NACL 100-0.9 UT/100ML-% IV SOLN
INTRAVENOUS | Status: DC
  Administered 2022-05-03: 7 [IU]/h via INTRAVENOUS
  Filled 2022-05-03: qty 100

## 2022-05-03 MED ORDER — SODIUM CHLORIDE 0.9 % IV BOLUS
1000.0000 mL | Freq: Once | INTRAVENOUS | Status: AC
  Administered 2022-05-03: 1000 mL via INTRAVENOUS

## 2022-05-03 MED ORDER — LACTATED RINGERS IV SOLN: INTRAVENOUS | Status: DC

## 2022-05-03 MED ORDER — DEXTROSE 50 % IV SOLN: 0.0000 mL | INTRAVENOUS | Status: DC | PRN

## 2022-05-03 MED ORDER — DEXTROSE IN LACTATED RINGERS 5 % IV SOLN: INTRAVENOUS | Status: DC

## 2022-05-03 NOTE — ED Notes (Signed)
CBG in triage 566.   Not crossing to epic

## 2022-05-03 NOTE — ED Provider Notes (Signed)
[facility]-EMERGENCY DEPT Provider Note   CSN: [id number] Arrival date & time: 05/03/22  2101     History  Chief Complaint  Patient presents with   Fatigue   Hyperglycemia    Tyler Maxwell is a 36 y.o. male.  The history is provided by the patient and medical records.  Hyperglycemia Associated symptoms: increased thirst, nausea and polyuria    36 year old male presenting to the ED with fatigue and concern for diabetes.  States of the past few weeks he has noticed increased thirst and frequent urination, especially at night.  He has also had some tingling in fingertips of his left hand without any known injury or trauma.  Wife encouraged him to check blood glucose at home and was getting readings of 400-500.  He has no known history of diabetes.  Reports he had full physical in December and all of his tests were normal aside from mildly elevated cholesterol and low vitamin D.  He is not currently on any supplementation/medication for these things.  He has no known family hx of DM.  He reports generally eating well, usually only once every 24 hours-- usually chicken or fish, vegetables.    Home Medications Prior to Admission medications   Medication Sig Start Date End Date Taking? Authorizing Provider  amoxicillin-clavulanate (AUGMENTIN) 875-125 MG tablet Take 1 tablet by mouth 2 (two) times daily. 06/17/17   Betha Loa, MD  HYDROcodone-acetaminophen (NORCO) 5-325 MG tablet Take 1 tablet by mouth daily as needed. 05/10/17   Tarry Kos, MD  HYDROcodone-acetaminophen [facility]) 5-325 MG tablet 1-2 tabs po q6 hours prn pain 06/17/17   Betha Loa, MD  ibuprofen (ADVIL,MOTRIN) 400 MG tablet Take 400 mg by mouth every 6 (six) hours as needed.    [provider]  ondansetron (ZOFRAN) 8 MG tablet Take 1 tablet (8 mg total) by mouth every 8 (eight) hours as needed for nausea or vomiting. 10/25/20   Rhys Martini, PA-C      Allergies    Patient has no known  allergies.    Review of Systems   Review of Systems  Gastrointestinal:  Positive for nausea.  Endocrine: Positive for polydipsia and polyuria.  All other systems reviewed and are negative.   Physical Exam Updated Vital Signs BP 127/72 (BP Location: Left Arm)   Pulse 82   Temp 97.9 F (36.6 C)   Resp 18   Ht 6\' 3"  (1.905 m)   Wt 74.8 kg   SpO2 100%   BMI 20.62 kg/m   Physical Exam Vitals and nursing note reviewed.  Constitutional:      Appearance: He is well-developed.     Comments: Tall, thin  HENT:     Head: Normocephalic and atraumatic.     Mouth/Throat:     Mouth: Mucous membranes are dry.  Eyes:     Conjunctiva/sclera: Conjunctivae normal.     Pupils: Pupils are equal, round, and reactive to light.  Cardiovascular:     Rate and Rhythm: Normal rate and regular rhythm.     Heart sounds: Normal heart sounds.  Pulmonary:     Effort: Pulmonary effort is normal.     Breath sounds: Normal breath sounds.  Abdominal:     General: Bowel sounds are normal.     Palpations: Abdomen is soft.  Musculoskeletal:        General: Normal range of motion.     Cervical back: Normal range of motion.  Skin:    General: Skin  is warm and dry.  Neurological:     Mental Status: He is alert and oriented to person, place, and time.     ED Results / Procedures / Treatments   Labs (all labs ordered are listed, but only abnormal results are displayed) Labs Reviewed  COMPREHENSIVE METABOLIC PANEL - Abnormal; Notable for the following components:      Result Value   Sodium 127 (*)    Chloride 93 (*)    CO2 17 (*)    Glucose, Bld 547 (*)    Total Bilirubin 2.0 (*)    Anion gap 17 (*)    All other components within normal limits  URINALYSIS, ROUTINE W REFLEX MICROSCOPIC - Abnormal; Notable for the following components:   Color, Urine STRAW (*)    Specific Gravity, Urine 1.031 (*)    Glucose, UA >=500 (*)    Ketones, ur 80 (*)    All other components within normal limits  BLOOD  GAS, VENOUS - Abnormal; Notable for the following components:   pCO2, Ven 43 (*)    pO2, Ven <31 (*)    Acid-base deficit 6.4 (*)    All other components within normal limits  BETA-HYDROXYBUTYRIC ACID - Abnormal; Notable for the following components:   Beta-Hydroxybutyric Acid 6.59 (*)    All other components within normal limits  CBG MONITORING, ED - Abnormal; Notable for the following components:   Glucose-Capillary 566 (*)    All other components within normal limits  CBG MONITORING, ED - Abnormal; Notable for the following components:   Glucose-Capillary 506 (*)    All other components within normal limits  CBC WITH DIFFERENTIAL/PLATELET  LIPASE, BLOOD    EKG None  Radiology No results found.  Procedures Procedures    CRITICAL CARE Performed by: Garlon Hatchet   Total critical care time: 45 minutes  Critical care time was exclusive of separately billable procedures and treating other patients.  Critical care was necessary to treat or prevent imminent or life-threatening deterioration.  Critical care was time spent personally by me on the following activities: development of treatment plan with patient and/or surrogate as well as nursing, discussions with consultants, evaluation of patient's response to treatment, examination of patient, obtaining history from patient or surrogate, ordering and performing treatments and interventions, ordering and review of laboratory studies, ordering and review of radiographic studies, pulse oximetry and re-evaluation of patient's condition.   Medications Ordered in ED Medications  insulin regular, human (MYXREDLIN) 100 units/ 100 mL infusion (7 Units/hr Intravenous New Bag/Given 05/03/22 2324)  lactated ringers infusion (has no administration in time range)  dextrose 5 % in lactated ringers infusion (has no administration in time range)  dextrose 50 % solution 0-50 mL (has no administration in time range)  potassium chloride 10 mEq  in 100 mL IVPB (10 mEq Intravenous New Bag/Given 05/03/22 2314)  sodium chloride 0.9 % bolus 1,000 mL (has no administration in time range)  sodium chloride 0.9 % bolus 1,000 mL (0 mLs Intravenous Stopped 05/03/22 2255)    ED Course/ Medical Decision Making/ A&P                           Medical Decision Making Amount and/or Complexity of Data Reviewed ECG/medicine tests: ordered and independent interpretation performed.  Risk Prescription drug management. Decision regarding hospitalization.   36 year old male here with fatigue, polyuria, polydipsia, and tingling of the left fingertips over the past few weeks.  Work-up today  is consistent with new onset diabetes along with DKA.  Labs with glucose of 547, bicarb 17, anion gap 17.  pH does remain normal.  UA with 80 ketones.  B-hydroxy 6.59.  Patient given 2L IVFB, will start insulin drip.  He will require admission for ongoing management.  Discussed with Dr. Hal Hope-- will admit for ongoing care.  Final Clinical Impression(s) / ED Diagnoses Final diagnoses:  New onset type 2 diabetes mellitus ([place])  Diabetic ketoacidosis without coma associated with type 2 diabetes mellitus [facility])    Rx / DC Orders ED Discharge Orders     None         Larene Pickett, PA-C 05/03/22 2325    Sherwood Gambler, MD 05/05/22 [phone]

## 2022-05-03 NOTE — ED Provider Triage Note (Signed)
Emergency Medicine Provider Triage Evaluation Note  Tyler Maxwell , a 36 y.o. male  was evaluated in triage.  Pt complains of fatigue for the past few months. Admits to numbness of left hand for the past 2 weeks. He checked his glucose which was elevated. No history of DM. Admits to frequent urination and intermittent blurry vision.  Review of Systems  Positive: Abdominal pain, fatigue Negative: fever  Physical Exam  BP 128/86 (BP Location: Left Arm)   Pulse 94   Temp (!) 97.4 F (36.3 C) (Oral)   Resp 17   Ht 6\' 3"  (1.905 m)   Wt 74.8 kg   SpO2 100%   BMI 20.62 kg/m  Gen:   Awake, no distress   Resp:  Normal effort  MSK:   Moves extremities without difficulty  Other:    Medical Decision Making  Medically screening exam initiated at 9:30 PM.  Appropriate orders placed.  Tyler Maxwell was informed that the remainder of the evaluation will be completed by another provider, this initial triage assessment does not replace that evaluation, and the importance of remaining in the ED until their evaluation is complete.  Patient hyperglycemic in triage. Hyperglycemia labs ordered   Tyler Maxwell 05/03/22 2131

## 2022-05-03 NOTE — ED Triage Notes (Addendum)
Reports feeling fatigued today.   Sts he decided to check blood sugar and had a 448, and 513.   No hx of diabetes. Also sts frequent urination.   Left  hand numbness.

## 2022-05-04 ENCOUNTER — Encounter (HOSPITAL_COMMUNITY): Payer: Self-pay | Admitting: Internal Medicine

## 2022-05-04 DIAGNOSIS — E1110 Type 2 diabetes mellitus with ketoacidosis without coma: Principal | ICD-10-CM

## 2022-05-04 LAB — GLUCOSE, CAPILLARY
Glucose-Capillary: 386 mg/dL — ABNORMAL HIGH (ref 70–99)
Glucose-Capillary: 345 mg/dL — ABNORMAL HIGH (ref 70–99)
Glucose-Capillary: 297 mg/dL — ABNORMAL HIGH (ref 70–99)
Glucose-Capillary: 245 mg/dL — ABNORMAL HIGH (ref 70–99)
Glucose-Capillary: 190 mg/dL — ABNORMAL HIGH (ref 70–99)
Glucose-Capillary: 150 mg/dL — ABNORMAL HIGH (ref 70–99)
Glucose-Capillary: 143 mg/dL — ABNORMAL HIGH (ref 70–99)
Glucose-Capillary: 129 mg/dL — ABNORMAL HIGH (ref 70–99)
Glucose-Capillary: 129 mg/dL — ABNORMAL HIGH (ref 70–99)
Glucose-Capillary: 137 mg/dL — ABNORMAL HIGH (ref 70–99)
Glucose-Capillary: 301 mg/dL — ABNORMAL HIGH (ref 70–99)
Glucose-Capillary: 154 mg/dL — ABNORMAL HIGH (ref 70–99)

## 2022-05-04 LAB — CBC
HCT: 36.4 % — ABNORMAL LOW (ref 39.0–52.0)
Hemoglobin: 13 g/dL (ref 13.0–17.0)
MCH: 29.7 pg (ref 26.0–34.0)
MCHC: 35.7 g/dL (ref 30.0–36.0)
MCV: 83.1 fL (ref 80.0–100.0)
Platelets: 217 10*3/uL (ref 150–400)
RBC: 4.38 MIL/uL (ref 4.22–5.81)
RDW: 12 % (ref 11.5–15.5)
WBC: 5 10*3/uL (ref 4.0–10.5)
nRBC: 0 % (ref 0.0–0.2)

## 2022-05-04 LAB — BASIC METABOLIC PANEL
Anion gap: 10 (ref 5–15)
Anion gap: 3 — ABNORMAL LOW (ref 5–15)
BUN: 9 mg/dL (ref 6–20)
BUN: 7 mg/dL (ref 6–20)
CO2: 17 mmol/L — ABNORMAL LOW (ref 22–32)
CO2: 21 mmol/L — ABNORMAL LOW (ref 22–32)
Calcium: 8.2 mg/dL — ABNORMAL LOW (ref 8.9–10.3)
Calcium: 8.1 mg/dL — ABNORMAL LOW (ref 8.9–10.3)
Chloride: 104 mmol/L (ref 98–111)
Chloride: 109 mmol/L (ref 98–111)
Creatinine, Ser: 0.87 mg/dL (ref 0.61–1.24)
Creatinine, Ser: 0.66 mg/dL (ref 0.61–1.24)
GFR, Estimated: >60 mL/min (ref >60)
GFR, Estimated: >60 mL/min (ref >60)
Glucose, Bld: 357 mg/dL — ABNORMAL HIGH (ref 70–99)
Glucose, Bld: 167 mg/dL — ABNORMAL HIGH (ref 70–99)
Potassium: 3.8 mmol/L (ref 3.5–5.1)
Potassium: 3.5 mmol/L (ref 3.5–5.1)
Sodium: 131 mmol/L — ABNORMAL LOW (ref 135–145)
Sodium: 133 mmol/L — ABNORMAL LOW (ref 135–145)

## 2022-05-04 LAB — BETA-HYDROXYBUTYRIC ACID: Beta-Hydroxybutyric Acid: 3.63 mmol/L — ABNORMAL HIGH (ref 0.05–0.27)

## 2022-05-04 LAB — HEMOGLOBIN A1C
Hgb A1c MFr Bld: 11.7 % — ABNORMAL HIGH (ref 4.8–5.6)
Mean Plasma Glucose: 289.09 mg/dL

## 2022-05-04 LAB — MRSA NEXT GEN BY PCR, NASAL: MRSA by PCR Next Gen: NOT DETECTED

## 2022-05-04 LAB — HIV ANTIBODY (ROUTINE TESTING W REFLEX): HIV Screen 4th Generation wRfx: NONREACTIVE

## 2022-05-04 MED ORDER — INSULIN ASPART 100 UNIT/ML IJ SOLN: 0.0000 [IU] | Freq: Every day | INTRAMUSCULAR | Status: DC

## 2022-05-04 MED ORDER — DEXTROSE IN LACTATED RINGERS 5 % IV SOLN: INTRAVENOUS | Status: DC

## 2022-05-04 MED ORDER — INSULIN ASPART 100 UNIT/ML IJ SOLN
0.0000 [IU] | Freq: Three times a day (TID) | INTRAMUSCULAR | Status: DC
  Administered 2022-05-04: 1 [IU] via SUBCUTANEOUS
  Administered 2022-05-04: 7 [IU] via SUBCUTANEOUS
  Administered 2022-05-05: 3 [IU] via SUBCUTANEOUS
  Administered 2022-05-05: 7 [IU] via SUBCUTANEOUS

## 2022-05-04 MED ORDER — NICOTINE 21 MG/24HR TD PT24
21.0000 mg | MEDICATED_PATCH | Freq: Every day | TRANSDERMAL | Status: DC
  Administered 2022-05-04: 21 mg via TRANSDERMAL
  Filled 2022-05-04 (×2): qty 1

## 2022-05-04 MED ORDER — INSULIN STARTER KIT- PEN NEEDLES (ENGLISH)
1.0000 | Freq: Once | Status: AC
  Administered 2022-05-04: 1
  Filled 2022-05-04: qty 1

## 2022-05-04 MED ORDER — INSULIN ASPART 100 UNIT/ML IJ SOLN
4.0000 [IU] | Freq: Three times a day (TID) | INTRAMUSCULAR | Status: DC
  Administered 2022-05-04 – 2022-05-05 (×4): 4 [IU] via SUBCUTANEOUS

## 2022-05-04 MED ORDER — LIVING WELL WITH DIABETES BOOK
Freq: Once | Status: AC
  Filled 2022-05-04: qty 1

## 2022-05-04 MED ORDER — CHLORHEXIDINE GLUCONATE CLOTH 2 % EX PADS
6.0000 | MEDICATED_PAD | Freq: Every day | CUTANEOUS | Status: DC
  Administered 2022-05-04 (×2): 6 via TOPICAL

## 2022-05-04 MED ORDER — INSULIN REGULAR(HUMAN) IN NACL 100-0.9 UT/100ML-% IV SOLN
INTRAVENOUS | Status: DC
  Administered 2022-05-04: 6.5 [IU]/h via INTRAVENOUS

## 2022-05-04 MED ORDER — LACTATED RINGERS IV SOLN: INTRAVENOUS | Status: DC

## 2022-05-04 MED ORDER — ORAL CARE MOUTH RINSE: 15.0000 mL | OROMUCOSAL | Status: DC | PRN

## 2022-05-04 MED ORDER — INSULIN GLARGINE-YFGN 100 UNIT/ML ~~LOC~~ SOLN
10.0000 [IU] | Freq: Every day | SUBCUTANEOUS | Status: DC
  Administered 2022-05-04: 10 [IU] via SUBCUTANEOUS
  Filled 2022-05-04 (×3): qty 0.1

## 2022-05-04 MED ORDER — ENOXAPARIN SODIUM 40 MG/0.4ML IJ SOSY
40.0000 mg | PREFILLED_SYRINGE | INTRAMUSCULAR | Status: DC
  Administered 2022-05-04: 40 mg via SUBCUTANEOUS
  Filled 2022-05-04 (×2): qty 0.4

## 2022-05-04 MED ORDER — DEXTROSE 50 % IV SOLN: 0.0000 mL | INTRAVENOUS | Status: DC | PRN

## 2022-05-04 NOTE — TOC Progression Note (Signed)
Transition of Care [facility]) - Progression Note    Patient Details  Name: Tyler Maxwell MRN: [id number] Date of Birth: 1986-08-11  Transition of Care [facility]) CM/SW Contact  Coralyn Helling, [place] Phone Number: 05/04/2022, 10:06 AM  Clinical Narrative:     Transition of Care (TOC) Screening Note   Patient Details  Name: Tyler Maxwell Date of Birth: 1985-11-15   Transition of Care [facility]) CM/SW Contact:    Coralyn Helling, LCSW Phone Number: 05/04/2022, 10:06 AM    Transition of Care Department [facility]) has reviewed patient and no TOC needs have been identified at this time. We will continue to monitor patient advancement through interdisciplinary progression rounds. If new patient transition needs arise, please place a TOC consult.          Expected Discharge Plan and Services                                                 Social Determinants of Health (SDOH) Interventions    Readmission Risk Interventions     No data to display

## 2022-05-04 NOTE — Progress Notes (Signed)
Nutrition Note  RD consulted for diet education regarding diabetes.  Placed Carbohydrate Counting handout in AVS. Was seen by DM coordinator. Will place outpatient referral for diabetes management.  Wt Readings from Last 15 Encounters:  05/04/22 73.3 kg  06/16/17 77.1 kg  04/26/17 73.5 kg  07/08/15 77.1 kg  09/07/13 79.4 kg    Body mass index is 20.2 kg/m. Patient meets criteria for normal based on current BMI.   Current diet order is CHO modified. Labs and medications reviewed.   No nutrition interventions warranted at this time. If nutrition issues arise, please consult RD.   Tilda Franco, MS, RD, LDN Inpatient Clinical Dietitian Contact information available via Amion

## 2022-05-04 NOTE — Inpatient Diabetes Management (Signed)
Inpatient Diabetes Program Recommendations  AACE/ADA: New Consensus Statement on Inpatient Glycemic Control (2015)  Target Ranges:  Prepandial:   less than 140 mg/dL      Peak postprandial:   less than 180 mg/dL (1-2 hours)      Critically ill patients:  140 - 180 mg/dL   Lab Results  Component Value Date   GLUCAP 137 (H) 05/04/2022   HGBA1C 11.7 (H) 05/04/2022    Review of Glycemic Control  Diabetes history: New-onset DM Outpatient Diabetes medications: None Current orders for Inpatient glycemic control: IV insulin for DKA, transitioned to Semglee 10 units QD, Novolog 0-9 units TID with meals and 0-5 HS + 4 units TID  HgbA1C - 11.7%  Inpatient Diabetes Program Recommendations:    Will likely need increase in meal coverage insulin. Watch post-prandials.  Spoke with patient about new diabetes diagnosis.  Discussed A1C results (11.7%) and explained what an A1C is and informed patient that his current A1C indicates an average glucose of 265 mg/dl over the past 2-3 months. Discussed basic pathophysiology of DM Type 2, basic home care, importance of checking CBGs and maintaining good CBG control to prevent long-term and short-term complications. Reviewed glucose and A1C goals and explained that patient will need to continue to monitor blood sugars at least 3-4x/day. Reviewed signs and symptoms of hyperglycemia and hypoglycemia along with treatment for both. Discussed impact of nutrition, exercise, stress, sickness, and medications on diabetes control. Reviewed Living Well with diabetes booklet and encouraged patient to read through entire book.  Educated patient and spouse on insulin pen use at home. Reviewed contents of insulin flexpen starter kit. Reviewed all steps if insulin pen including attachment of needle, 2-unit air shot, dialing up dose, giving injection, removing needle, disposal of sharps, storage of unused insulin, disposal of insulin etc. Patient able to provide successful return  demonstration. Also reviewed troubleshooting with insulin pen. MD to give patient Rxs for insulin pens and insulin pen needles.  Pt seems motivated to control his blood sugars and hs f/u appt with his PCP in 2 weeks.  Thank you. Lorenda Peck, RD, LDN, [place] Inpatient Diabetes Coordinator [phone]

## 2022-05-04 NOTE — Progress Notes (Signed)
PROGRESS NOTE    Tyler Maxwell  KGS:[id number] DOB: 12/29/1985 DOA: 05/03/2022 PCP: Patient, No Pcp Per    Brief Narrative:   Tyler Maxwell is a 36 y.o. male with no significant past medical history who presented to [facility] ED on 8/30 with increasing urination, thirst, paresthesias to upper/lower extremities over the last few days associated with weight loss.  In the ED, temperature 97.4 F, HR 69, RR 17, BP 128/86, SPO2 100% on room air.  WBC 5.1, hemoglobin 15.4, platelets 272.  Sodium 127, potassium 4.2, chloride 93, CO2 17, glucose 547.  BUN 9, creatinine 0.97.  Lipase 38, AST 18, ALT 24, total bilirubin 2.0.  VBG with pH 7.28.  Beta hydroxybutyrate acid 6.59.  Urinalysis with 80 ketones.  Hemoglobin A1c 11.7.  Patient started on IV fluid bolus, insulin drip.  EDP requested admission for diabetic ketoacidosis with new onset type 2 diabetes mellitus.  Assessment & Plan:   Diabetic ketoacidosis Type 2 diabetes mellitus with hyperglycemia; new onset Patient presenting to ED with progressive urination, thirst, paresthesias over the last few days associated with weight loss.  On ED arrival was found to have an elevated glucose of 547.  No previous history of diabetes.  Patient also with elevated beta hydroxybutyrate acid, anion gap and ketones in the urine.  Patient was initially started on insulin drip with IV fluid resuscitation with improvement of glucose and closure of anion gap. --Diabetic educator and dietitian consulted --Transition insulin drip to Semglee 10u [place] daily --start Novolog 4u TIDAC --sensitive SSI for coverage --CBGs QAC/HS --Will need further titration of insulin regimen and extensive diabetic teaching before stable for discharge home  Pseudohyponatremia Sodium 127 on admission in the setting of elevated glucose of 547; corrected for hyperglycemia sodium equal to 134.  Continue treatment as above. --Repeat BMP in a.m.   DVT prophylaxis: enoxaparin (LOVENOX) injection 40  mg Start: 05/04/22 1000    Code Status: Full Code Family Communication: Updated family present at bedside  Disposition Plan:  Level of care: Stepdown Status is: Observation The patient remains OBS appropriate and will d/c before 2 midnights.    Consultants:  None  Procedures:  None  Antimicrobials:  None   Subjective: Patient seen and examined at bedside, resting comfortably.  Reports symptoms on presentation much improved today.  Family present.  Remains on insulin drip.  Anion gap now closed with improvement of glucose.  Discussed will transition insulin drip to subcutaneous insulin today.  Discussed will need further teaching regarding insulin administration as well as diet education before stable for discharge home.  No other questions or concerns at this time.  Denies headache, no visual changes, no chest pain, no palpitations, no shortness of breath, no fever/chills/night sweats, no nausea/vomiting/diarrhea, no abdominal pain, no focal weakness, no current paresthesias, no fatigue, no cough/congestion.  No acute events overnight per nursing staff.  Objective: Vitals:   05/04/22 0500 05/04/22 0600 05/04/22 0700 05/04/22 0800  BP: 110/63 112/67 114/63   Pulse: 73 73 66   Resp: _0 Temp:    97.9 F (36.6 C)  TempSrc:    Oral  SpO2: 97% 98% 97%   Weight:      Height:        Intake/Output Summary (Last 24 hours) at 05/04/2022 0958 Last data filed at 05/04/2022 0644 Gross per 24 hour  Intake 2881.55 ml  Output 350 ml  Net 2531.55 ml   Filed Weights   05/03/22 2106 05/04/22 0100  Weight:  74.8 kg 73.3 kg    Examination:  Physical Exam: GEN: NAD, alert and oriented x 3, wd/wn HEENT: NCAT, PERRL, EOMI, sclera clear, MMM PULM: CTAB w/o wheezes/crackles, normal respiratory effort, on room air CV: RRR w/o M/G/R GI: abd soft, NTND, NABS, no R/G/M MSK: no peripheral edema, muscle strength globally intact 5/5 bilateral upper/lower extremities NEURO: CN II-XII  intact, no focal deficits, sensation to light touch intact PSYCH: normal mood/affect Integumentary: dry/intact, no rashes or wounds    Data Reviewed: I have personally reviewed following labs and imaging studies  CBC: Recent Labs  Lab 05/03/22 2145 05/04/22 0116  WBC 5.1 5.0  NEUTROABS 2.4  --   HGB 15.4 13.0  HCT 43.7 36.4*  MCV 83.4 83.1  PLT 272 962   Basic Metabolic Panel: Recent Labs  Lab 05/03/22 2145 05/04/22 0116 05/04/22 0554  NA 127* 131* 133*  K 4.2 3.8 3.5  CL 93* 104 109  CO2 17* 17* 21*  GLUCOSE 547* 357* 167*  BUN _0 CREATININE 0.97 0.87 0.66  CALCIUM 9.2 8.2* 8.1*   GFR: Estimated Creatinine Clearance: 131.1 mL/min (by C-G formula based on SCr of 0.66 mg/dL). Liver Function Tests: Recent Labs  Lab 05/03/22 2145  AST 18  ALT 24  ALKPHOS 87  BILITOT 2.0*  PROT 7.4  ALBUMIN 4.3   Recent Labs  Lab 05/03/22 2145  LIPASE 38   No results for input(s): "AMMONIA" in the last 168 hours. Coagulation Profile: No results for input(s): "INR", "PROTIME" in the last 168 hours. Cardiac Enzymes: No results for input(s): "CKTOTAL", "CKMB", "CKMBINDEX", "TROPONINI" in the last 168 hours. BNP (last 3 results) No results for input(s): "PROBNP" in the last 8760 hours. HbA1C: Recent Labs    05/04/22 0116  HGBA1C 11.7*   CBG: Recent Labs  Lab 05/04/22 0537 05/04/22 0641 05/04/22 0747 05/04/22 0849 05/04/22 0952  GLUCAP 190* 150* 143* 129* 129*   Lipid Profile: No results for input(s): "CHOL", "HDL", "LDLCALC", "TRIG", "CHOLHDL", "LDLDIRECT" in the last 72 hours. Thyroid Function Tests: No results for input(s): "TSH", "T4TOTAL", "FREET4", "T3FREE", "THYROIDAB" in the last 72 hours. Anemia Panel: No results for input(s): "VITAMINB12", "FOLATE", "FERRITIN", "TIBC", "IRON", "RETICCTPCT" in the last 72 hours. Sepsis Labs: No results for input(s): "PROCALCITON", "LATICACIDVEN" in the last 168 hours.  Recent Results (from the past 240 hour(s))   MRSA Next Gen by PCR, Nasal     Status: None   Collection Time: 05/04/22  1:27 AM   Specimen: Nasal Mucosa; Nasal Swab  Result Value Ref Range Status   MRSA by PCR Next Gen NOT DETECTED NOT DETECTED Final    Comment: (NOTE) The GeneXpert MRSA Assay (FDA approved for NASAL specimens only), is one component of a comprehensive MRSA colonization surveillance program. It is not intended to diagnose MRSA infection nor to guide or monitor treatment for MRSA infections. Test performance is not FDA approved in patients less than 51 years old. Performed at [facility], [place] [address] [place], [place] [address]          Radiology Studies: No results found.      Scheduled Meds:  Chlorhexidine Gluconate Cloth  6 each Topical Daily   enoxaparin (LOVENOX) injection  40 mg Subcutaneous Q24H   insulin aspart  0-5 Units Subcutaneous QHS   insulin aspart  0-9 Units Subcutaneous TID WC   insulin aspart  4 Units Subcutaneous TID WC   insulin glargine-yfgn  10 Units Subcutaneous Daily   insulin  starter kit- pen needles  1 kit Other Once   living well with diabetes book   Does not apply Once   Continuous Infusions:  dextrose 5% lactated ringers 125 mL/hr at 05/04/22 0600   insulin 1.2 Units/hr (05/04/22 0953)   lactated ringers 125 mL/hr at 05/04/22 0400     LOS: 0 days    Time spent: 52 minutes spent on chart review, discussion with nursing staff, consultants, updating family and interview/physical exam; more than 50% of that time was spent in counseling and/or coordination of care.    Eric J [place] DO [facility] Available via Epic secure chat 7am-7pm After these hours, please refer to coverage provider listed on [email or web address] 05/04/2022, 9:58 AM

## 2022-05-04 NOTE — TOC Progression Note (Signed)
Transition of Care [facility]) - Progression Note    Patient Details  Name: Tyler Maxwell MRN: [id number] Date of Birth: May 13, 1986  [facility]) CM/SW Contact  Otelia Santee, LCSW Phone Number: 05/04/2022, 3:02 PM  Clinical Narrative:    [facility] consulted for PCP needs however, pt has insurance and will need to call insurance provider to get a list of in-network providers to schedule an appointment for themselves.         Expected Discharge Plan and Services                                                 Social Determinants of Health (SDOH) Interventions    Readmission Risk Interventions     No data to display

## 2022-05-04 NOTE — Discharge Instructions (Signed)

## 2022-05-04 NOTE — Plan of Care (Signed)
Discussed with patient plan of care for the evening, pain management and admission questions with some teach back displayed  Problem: Education: Goal: Knowledge of General Education information will improve Description: Including pain rating scale, medication(s)/side effects and non-pharmacologic comfort measures Outcome: Progressing   Problem: Health Behavior/Discharge Planning: Goal: Ability to manage health-related needs will improve Outcome: Progressing   

## 2022-05-04 NOTE — H&P (Signed)
History and Physical    Tyler Maxwell HYW:[id number] DOB: Jul 26, 1986 DOA: 05/03/2022  PCP: Patient, No Pcp Per  Patient coming from: Home.  Chief Complaint: Increased urination and thirst.  HPI: Tyler Maxwell is a 36 y.o. male with no significant past medical history presents to the ER because of increasing urination and thirst and numbness of the upper and lower extremities for the last few days.  Also noticed some loss of weight and also had a oral thrush.  ED Course: In the ER patient is found to have elevated blood sugar with anion gap of 17.  UA shows ketones.  Patient was started on fluid bolus IV insulin infusion admitted for diabetic ketoacidosis with new onset diabetes.  Review of Systems: As per HPI, rest all negative.   Past Medical History:  Diagnosis Date   Depression    No significant past medical history     Past Surgical History:  Procedure Laterality Date   FOREIGN BODY REMOVAL Left 04/26/2017   Procedure: FOREIGN BODY REMOVAL LEFT HAND;  Surgeon: Tarry Kos, MD;  Location: [facility];  Service: Orthopedics;  Laterality: Left;   I & D EXTREMITY Left 06/17/2017   Procedure: IRRIGATION AND DEBRIDEMENT EXTREMITY;  Surgeon: Betha Loa, MD;  Location: [facility] OR;  Service: Orthopedics;  Laterality: Left;     reports that he has been smoking cigarettes. He has been smoking an average of 1 pack per day. He has never used smokeless tobacco. He reports current alcohol use of about 120.0 standard drinks of alcohol per week. He reports that he does not use drugs.  No Known Allergies  Family History  Problem Relation Age of Onset   Healthy Mother    Healthy Father     Prior to Admission medications   Medication Sig Start Date End Date Taking? Authorizing Provider  amoxicillin-clavulanate (AUGMENTIN) 875-125 MG tablet Take 1 tablet by mouth 2 (two) times daily. 06/17/17   Betha Loa, MD  HYDROcodone-acetaminophen (NORCO) 5-325 MG tablet Take 1 tablet by  mouth daily as needed. 05/10/17   Tarry Kos, MD  HYDROcodone-acetaminophen [facility]) 5-325 MG tablet 1-2 tabs po q6 hours prn pain 06/17/17   Betha Loa, MD  ibuprofen (ADVIL,MOTRIN) 400 MG tablet Take 400 mg by mouth every 6 (six) hours as needed.    [provider]  ondansetron (ZOFRAN) 8 MG tablet Take 1 tablet (8 mg total) by mouth every 8 (eight) hours as needed for nausea or vomiting. 10/25/20   Rhys Martini, PA-C    Physical Exam: Constitutional: Moderately built and nourished. Vitals:   05/03/22 2106 05/03/22 2236  BP: 128/86 127/72  Pulse: 94 82  Resp: 17 18  Temp: (!) 97.4 F (36.3 C) 97.9 F (36.6 C)  TempSrc: Oral   SpO2: 100% 100%  Weight: 74.8 kg   Height: 6\' 3"  (1.905 m)    Eyes: Anicteric no pallor. ENMT: No discharge from the ears eyes nose and mouth. Neck: No mass felt.  No neck rigidity. Respiratory: No rhonchi or crepitations. Cardiovascular: S1-S2 heard. Abdomen: Soft nontender bowel sound present. Musculoskeletal: No edema. Skin: No rash. Neurologic: Alert awake oriented to time place and person.  Moves all extremities. Psychiatric: Appears normal.  Normal affect.   Labs on Admission: I have personally reviewed following labs and imaging studies  CBC: Recent Labs  Lab 05/03/22 2145  WBC 5.1  NEUTROABS 2.4  HGB 15.4  HCT 43.7  MCV 83.4  PLT 272   Basic Metabolic  Panel: Recent Labs  Lab 05/03/22 2145  NA 127*  K 4.2  CL 93*  CO2 17*  GLUCOSE 547*  BUN 9  CREATININE 0.97  CALCIUM 9.2   GFR: Estimated Creatinine Clearance: 110.3 mL/min (by C-G formula based on SCr of 0.97 mg/dL). Liver Function Tests: Recent Labs  Lab 05/03/22 2145  AST 18  ALT 24  ALKPHOS 87  BILITOT 2.0*  PROT 7.4  ALBUMIN 4.3   Recent Labs  Lab 05/03/22 2145  LIPASE 38   No results for input(s): "AMMONIA" in the last 168 hours. Coagulation Profile: No results for input(s): "INR", "PROTIME" in the last 168 hours. Cardiac Enzymes: No  results for input(s): "CKTOTAL", "CKMB", "CKMBINDEX", "TROPONINI" in the last 168 hours. BNP (last 3 results) No results for input(s): "PROBNP" in the last 8760 hours. HbA1C: No results for input(s): "HGBA1C" in the last 72 hours. CBG: Recent Labs  Lab 05/03/22 2128 05/03/22 2320 05/03/22 2354  GLUCAP 566* 506* 442*   Lipid Profile: No results for input(s): "CHOL", "HDL", "LDLCALC", "TRIG", "CHOLHDL", "LDLDIRECT" in the last 72 hours. Thyroid Function Tests: No results for input(s): "TSH", "T4TOTAL", "FREET4", "T3FREE", "THYROIDAB" in the last 72 hours. Anemia Panel: No results for input(s): "VITAMINB12", "FOLATE", "FERRITIN", "TIBC", "IRON", "RETICCTPCT" in the last 72 hours. Urine analysis:    Component Value Date/Time   COLORURINE STRAW (A) 05/03/2022 2146   APPEARANCEUR CLEAR 05/03/2022 2146   LABSPEC 1.031 (H) 05/03/2022 2146   PHURINE 5.0 05/03/2022 2146   GLUCOSEU >=500 (A) 05/03/2022 2146   HGBUR NEGATIVE 05/03/2022 2146   BILIRUBINUR NEGATIVE 05/03/2022 2146   KETONESUR 80 (A) 05/03/2022 2146   PROTEINUR NEGATIVE 05/03/2022 2146   UROBILINOGEN 0.2 10/12/2013 0205   NITRITE NEGATIVE 05/03/2022 2146   LEUKOCYTESUR NEGATIVE 05/03/2022 2146   Sepsis Labs: @LABRCNTIP (procalcitonin:4,lacticidven:4) )No results found for this or any previous visit (from the past 240 hour(s)).   Radiological Exams on Admission: No results found.    Assessment/Plan Principal Problem:   DKA (diabetic ketoacidosis) (HCC)    Diabetic ketoacidosis -new onset diabetes.  Likely diabetes type 2.  Will check C-peptide and glutamic acid decarboxylase antibodies.  Patient on IV fluids IV insulin infusion check frequent metabolic panel once anion gap gets corrected change to subcutaneous insulin. Hyponatremia likely from hyperglycemia which I would think will improve with correction of glucose and hydration.   DVT prophylaxis: Lovenox: Code Status: Full code. Family Communication:  Patient's wife at the bedside. Disposition Plan: Home. Consults called: None. Admission status: Observation.   MD [facility] Pager [phone].  If 7PM-7AM, please contact night-coverage [email or web address] Password [facility]  05/04/2022, 12:23 AM

## 2022-05-05 DIAGNOSIS — E1010 Type 1 diabetes mellitus with ketoacidosis without coma: Secondary | ICD-10-CM

## 2022-05-05 LAB — BASIC METABOLIC PANEL
Anion gap: 7 (ref 5–15)
BUN: 5 mg/dL — ABNORMAL LOW (ref 6–20)
CO2: 25 mmol/L (ref 22–32)
Calcium: 8.2 mg/dL — ABNORMAL LOW (ref 8.9–10.3)
Chloride: 104 mmol/L (ref 98–111)
Creatinine, Ser: 0.67 mg/dL (ref 0.61–1.24)
GFR, Estimated: >60 mL/min (ref >60)
Glucose, Bld: 285 mg/dL — ABNORMAL HIGH (ref 70–99)
Potassium: 3.2 mmol/L — ABNORMAL LOW (ref 3.5–5.1)
Sodium: 136 mmol/L (ref 135–145)

## 2022-05-05 LAB — GLUCOSE, CAPILLARY
Glucose-Capillary: 259 mg/dL — ABNORMAL HIGH (ref 70–99)
Glucose-Capillary: 307 mg/dL — ABNORMAL HIGH (ref 70–99)
Glucose-Capillary: 226 mg/dL — ABNORMAL HIGH (ref 70–99)

## 2022-05-05 LAB — C-PEPTIDE: C-Peptide: 0.4 ng/mL — ABNORMAL LOW (ref 1.1–4.4)

## 2022-05-05 LAB — MAGNESIUM: Magnesium: 1.9 mg/dL (ref 1.7–2.4)

## 2022-05-05 MED ORDER — "PEN NEEDLES 3/16"" 31G X 5 MM MISC": 2 refills | Status: AC

## 2022-05-05 MED ORDER — INSULIN LISPRO (1 UNIT DIAL) 100 UNIT/ML (KWIKPEN): 5.0000 [IU] | PEN_INJECTOR | Freq: Three times a day (TID) | SUBCUTANEOUS | 2 refills | Status: AC

## 2022-05-05 MED ORDER — POTASSIUM CHLORIDE CRYS ER 20 MEQ PO TBCR
40.0000 meq | EXTENDED_RELEASE_TABLET | ORAL | Status: DC
Start: 2022-05-05 — End: 2022-05-05
  Administered 2022-05-05: 40 meq via ORAL
  Filled 2022-05-05: qty 2

## 2022-05-05 MED ORDER — BASAGLAR KWIKPEN 100 UNIT/ML ~~LOC~~ SOPN: 15.0000 [IU] | PEN_INJECTOR | Freq: Every day | SUBCUTANEOUS | 2 refills | Status: DC

## 2022-05-05 MED ORDER — INSULIN GLARGINE-YFGN 100 UNIT/ML ~~LOC~~ SOLN
15.0000 [IU] | Freq: Every day | SUBCUTANEOUS | Status: DC
  Administered 2022-05-05: 15 [IU] via SUBCUTANEOUS
  Filled 2022-05-05: qty 0.15

## 2022-05-05 MED ORDER — INSULIN GLARGINE 100 UNIT/ML SOLOSTAR PEN: 15.0000 [IU] | PEN_INJECTOR | Freq: Every day | SUBCUTANEOUS | 2 refills | Status: AC

## 2022-05-05 MED ORDER — BLOOD GLUCOSE METER KIT
PACK | 0 refills | Status: AC
Start: 2022-05-05 — End: ?

## 2022-05-05 NOTE — Inpatient Diabetes Management (Signed)
Inpatient Diabetes Program Recommendations  AACE/ADA: New Consensus Statement on Inpatient Glycemic Control (2015)  Target Ranges:  Prepandial:   less than 140 mg/dL      Peak postprandial:   less than 180 mg/dL (1-2 hours)      Critically ill patients:  140 - 180 mg/dL   Lab Results  Component Value Date   GLUCAP 307 (H) 05/05/2022   HGBA1C 11.7 (H) 05/04/2022    Review of Glycemic Control  259, 307 mg/dL this am   Current orders for Inpatient glycemic control: Semglee 15 units QD, Novolog 0-9 units TID with meals and 0-5 HS + 4 units TID  HgbA1C - 11.7% C-peptide - 0.4 - consistent with beta-cell failure, likely Type 1 DM  Inpatient Diabetes Program Recommendations:    For discharge:  Lantus/Levemir/Basaglar pen 15 units QD Novolog/Humalog 5 units TID with meals Insulin pen needles (#[id number])  Pt has glucose meter and supplies at home.  Spoke with pt at bedside regarding new onset DM, likely Type 1. Reviewed insulin administration, hypo s/s and treatment, nutrition and healthy eating, exercise and stress management. Instructed to check blood sugars at least 4x/day and take meter to PCP OV for review. Call PCP if blood sugars consistently > 250 mg/dL. Answered all questions. Pt very motivated to control blood sugars and feels like he has the tools needed to do so. Discussed again glucose and HgbA1C goals.  Reviewed insulin pen administration and pt was able to return demonstration. Gave phone number for any questions that may arise.  Thank you. Ailene Ards, RD, LDN, CDCES Inpatient Diabetes Coordinator [phone]

## 2022-05-05 NOTE — Discharge Summary (Signed)
Physician Discharge Summary  Tyler Maxwell FBP:[id number] DOB: [birth year] DOA: 05/03/2022  PCP: Patient, No Pcp Per  Admit date: 05/03/2022 Discharge date: 05/05/2022  Admitted From: Home Disposition: Home  Recommendations for Outpatient Follow-up:  Follow up with PCP in 1-2 weeks Started on insulin glargine 15 units subcu insulin daily and Humalog 5 units 3 times daily AC for new diagnosis of type 1 diabetes mellitus Will need likely further titration of insulin regimen Please follow up on the following pending results: GAD results that are pending at time of discharge  Home Health: None Equipment/Devices: None  Discharge Condition: Stable CODE STATUS: Full code Diet recommendation: Consistent carbohydrate diet  History of present illness:  Tyler Maxwell is a 36 y.o. male with no significant past medical history who presented to [facility] ED on 8/30 with increasing urination, thirst, paresthesias to upper/lower extremities over the last few days associated with weight loss.   In the ED, temperature 97.4 F, HR 69, RR 17, BP 128/86, SPO2 100% on room air.  WBC 5.1, hemoglobin 15.4, platelets 272.  Sodium 127, potassium 4.2, chloride 93, CO2 17, glucose 547.  BUN 9, creatinine 0.97.  Lipase 38, AST 18, ALT 24, total bilirubin 2.0.  VBG with pH 7.28.  Beta hydroxybutyrate acid 6.59.  Urinalysis with 80 ketones.  Hemoglobin A1c 11.7.  Patient started on IV fluid bolus, insulin drip.  EDP requested admission for diabetic ketoacidosis with new onset type 2 diabetes mellitus.  Hospital course:  Diabetic ketoacidosis Type 1 diabetes mellitus with hyperglycemia; new onset Patient presenting to ED with progressive urination, thirst, paresthesias over the last few days associated with weight loss.  On ED arrival was found to have an elevated glucose of 547.  No previous history of diabetes.  Patient also with elevated beta hydroxybutyrate acid, anion gap and ketones in the urine.  Patient was  initially started on insulin drip with IV fluid resuscitation with improvement of glucose and closure of anion gap.  Diabetic coordinator was consulted and followed during hospital course.  C-peptide level less than 0.4 consistent with type 1 diabetes.  GAD level pending at time of discharge.  Patient was started on subcutaneous insulin and glargine increased to 15 units apparently daily with Humalog 5 units 3 times daily AC.  Will need further titration of insulin likely outpatient with follow-up scheduled with PCP.    Pseudohyponatremia Sodium 127 on admission in the setting of elevated glucose of 547; corrected for hyperglycemia sodium equal to 134.    Discharge Diagnoses:  Principal Problem:   Diabetic ketoacidosis without coma associated with type 1 diabetes mellitus [facility])    Discharge Instructions  Discharge Instructions     Amb Referral to Nutrition and Diabetic Education   Complete by: As directed    Call MD for:  difficulty breathing, headache or visual disturbances   Complete by: As directed    Call MD for:  extreme fatigue   Complete by: As directed    Call MD for:  persistant dizziness or light-headedness   Complete by: As directed    Call MD for:  persistant nausea and vomiting   Complete by: As directed    Call MD for:  severe uncontrolled pain   Complete by: As directed    Call MD for:  temperature >100.4   Complete by: As directed    Diet Carb Modified   Complete by: As directed    Increase activity slowly   Complete by: As directed  Allergies as of 05/05/2022   No Known Allergies      Medication List     TAKE these medications    Basaglar KwikPen 100 UNIT/ML Inject 15 Units into the skin daily.   insulin lispro 100 UNIT/ML KwikPen Commonly known as: HumaLOG KwikPen Inject 5 Units into the skin 3 (three) times daily before meals.   Pen Needles 3/16" 31G X 5 MM Misc Use as directed with insulin pen        No Known  Allergies  Consultations: None   Procedures/Studies: No results found.   Subjective: Patient seen examined at bedside, resting comfortably.  Spouse present.  No specific complaints this morning.  Ready for discharge home.  Has follow-up scheduled with PCP.  Seen by diabetic coordinator once again this morning with all questions answered.  No other specific questions or concerns at this time.  Denies headache, no dizziness, no chest pain, no palpitations, no shortness of breath, no visual changes, no dizziness, no abdominal pain, no focal weakness, no fatigue, no paresthesias.  No acute events overnight per nursing staff.  Discharge Exam: Vitals:   05/04/22 2103 05/05/22 0610  BP: 115/76 117/75  Pulse: 66 (!) 59  Resp: 16 16  Temp: 98.1 F (36.7 C) 97.9 F (36.6 C)  SpO2: 100%    Vitals:   05/04/22 1230 05/04/22 1717 05/04/22 2103 05/05/22 0610  BP:  120/82 115/76 117/75  Pulse:  64 66 (!) 59  Resp:  16 16 16   Temp: 97.9 F (36.6 C) 97.7 F (36.5 C) 98.1 F (36.7 C) 97.9 F (36.6 C)  TempSrc: Oral Oral Oral Oral  SpO2:  100% 100%   Weight:      Height:        Physical Exam: GEN: NAD, alert and oriented x 3, thin in appearance HEENT: NCAT, PERRL, EOMI, sclera clear, MMM PULM: CTAB w/o wheezes/crackles, normal respiratory effort, on room air CV: RRR w/o M/G/R GI: abd soft, NTND, NABS, no R/G/M MSK: no peripheral edema, muscle strength globally intact 5/5 bilateral upper/lower extremities NEURO: CN II-XII intact, no focal deficits, sensation to light touch intact PSYCH: normal mood/affect Integumentary: dry/intact, no rashes or wounds    The results of significant diagnostics from this hospitalization (including imaging, microbiology, ancillary and laboratory) are listed below for reference.     Microbiology: Recent Results (from the past 240 hour(s))  MRSA Next Gen by PCR, Nasal     Status: None   Collection Time: 05/04/22  1:27 AM   Specimen: Nasal Mucosa;  Nasal Swab  Result Value Ref Range Status   MRSA by PCR Next Gen NOT DETECTED NOT DETECTED Final    Comment: (NOTE) The GeneXpert MRSA Assay (FDA approved for NASAL specimens only), is one component of a comprehensive MRSA colonization surveillance program. It is not intended to diagnose MRSA infection nor to guide or monitor treatment for MRSA infections. Test performance is not FDA approved in patients less than 65 years old. Performed at [facility], [address], [place], [place]      Labs: BNP (last 3 results) No results for input(s): "BNP" in the last 8760 hours. Basic Metabolic Panel: Recent Labs  Lab 05/03/22 2145 05/04/22 0116 05/04/22 0554 05/05/22 0537  NA 127* 131* 133* 136  K 4.2 3.8 3.5 3.2*  CL 93* 104 109 104  CO2 17* 17* 21* 25  GLUCOSE 547* 357* 167* 285*  BUN 9 9 7  5*  CREATININE 0.97 0.87 0.66 0.67  CALCIUM 9.2 8.2* 8.1* 8.2*  MG  --   --   --  1.9   Liver Function Tests: Recent Labs  Lab 05/03/22 2145  AST 18  ALT 24  ALKPHOS 87  BILITOT 2.0*  PROT 7.4  ALBUMIN 4.3   Recent Labs  Lab 05/03/22 2145  LIPASE 38   No results for input(s): "AMMONIA" in the last 168 hours. CBC: Recent Labs  Lab 05/03/22 2145 05/04/22 0116  WBC 5.1 5.0  NEUTROABS 2.4  --   HGB 15.4 13.0  HCT 43.7 36.4*  MCV 83.4 83.1  PLT 272 217   Cardiac Enzymes: No results for input(s): "CKTOTAL", "CKMB", "CKMBINDEX", "TROPONINI" in the last 168 hours. BNP: Invalid input(s): "POCBNP" CBG: Recent Labs  Lab 05/04/22 1718 05/04/22 2114 05/05/22 0731 05/05/22 1150 05/05/22 1303  GLUCAP 301* 154* 259* 307* 226*   D-Dimer No results for input(s): "DDIMER" in the last 72 hours. Hgb A1c Recent Labs    05/04/22 0116  HGBA1C 11.7*   Lipid Profile No results for input(s): "CHOL", "HDL", "LDLCALC", "TRIG", "CHOLHDL", "LDLDIRECT" in the last 72 hours. Thyroid function studies No results for input(s): "TSH", "T4TOTAL", "T3FREE",  "THYROIDAB" in the last 72 hours.  Invalid input(s): "FREET3" Anemia work up No results for input(s): "VITAMINB12", "FOLATE", "FERRITIN", "TIBC", "IRON", "RETICCTPCT" in the last 72 hours. Urinalysis    Component Value Date/Time   COLORURINE STRAW (A) 05/03/2022 2146   APPEARANCEUR CLEAR 05/03/2022 2146   LABSPEC 1.031 (H) 05/03/2022 2146   PHURINE 5.0 05/03/2022 2146   GLUCOSEU >=500 (A) 05/03/2022 2146   HGBUR NEGATIVE 05/03/2022 2146   BILIRUBINUR NEGATIVE 05/03/2022 2146   KETONESUR 80 (A) 05/03/2022 2146   PROTEINUR NEGATIVE 05/03/2022 2146   UROBILINOGEN 0.2 10/12/2013 0205   NITRITE NEGATIVE 05/03/2022 2146   LEUKOCYTESUR NEGATIVE 05/03/2022 2146   Sepsis Labs Recent Labs  Lab 05/03/22 2145 05/04/22 0116  WBC 5.1 5.0   Microbiology Recent Results (from the past 240 hour(s))  MRSA Next Gen by PCR, Nasal     Status: None   Collection Time: 05/04/22  1:27 AM   Specimen: Nasal Mucosa; Nasal Swab  Result Value Ref Range Status   MRSA by PCR Next Gen NOT DETECTED NOT DETECTED Final    Comment: (NOTE) The GeneXpert MRSA Assay (FDA approved for NASAL specimens only), is one component of a comprehensive MRSA colonization surveillance program. It is not intended to diagnose MRSA infection nor to guide or monitor treatment for MRSA infections. Test performance is not FDA approved in patients less than 38 years old. Performed at [facility], [address] [place], [place] [address]      Time coordinating discharge: Over 30 minutes  SIGNED:   Alvira Philips [place], DO  [facility] 05/05/2022, 1:14 PM

## 2022-05-06 ENCOUNTER — Telehealth: Payer: Self-pay | Admitting: Internal Medicine

## 2022-05-06 MED ORDER — BASAGLAR KWIKPEN 100 UNIT/ML ~~LOC~~ SOPN
15.0000 [IU] | PEN_INJECTOR | Freq: Every day | SUBCUTANEOUS | 2 refills | Status: AC
Start: 2022-05-06 — End: 2022-08-04

## 2022-05-06 MED ORDER — BASAGLAR KWIKPEN 100 UNIT/ML ~~LOC~~ SOPN: 15.0000 [IU] | PEN_INJECTOR | Freq: Every day | SUBCUTANEOUS | 2 refills | Status: AC

## 2022-05-06 NOTE — Telephone Encounter (Signed)
Refill

## 2022-05-08 LAB — GLUTAMIC ACID DECARBOXYLASE AUTO ABS: Glutamic Acid Decarb Ab: 289.2 U/mL — ABNORMAL HIGH (ref 0.0–5.0)

## 2022-06-27 ENCOUNTER — Encounter: Payer: Commercial Managed Care - HMO | Attending: Family Medicine | Admitting: Dietician

## 2022-06-27 DIAGNOSIS — E119 Type 2 diabetes mellitus without complications: Secondary | ICD-10-CM | POA: Diagnosis not present

## 2022-06-29 ENCOUNTER — Encounter: Payer: Self-pay | Admitting: Dietician

## 2022-06-29 NOTE — Progress Notes (Signed)
Labs noted to include:  Beta-Hydroxybutyric Acid 3.63 (high), Glucose 357, A1C 11.7%, GAD 289 (high), C-peptide 0.4 05/04/2022.  Consistent with Type 1 Diabetes per labs and Discharge Summary.  Medications currently include Ozempic and [place]. Patient is using a CGM. Called patient who states that his glucose is in the acceptable range per CGM.  Patient was seen on 06/27/2022 for the first of a series of three diabetes self[facility]management [facility]  Patient Education Plan per assessed needs and concerns is to attend three course education program for Diabetes Self Management Education.  A1C was see above  The following learning objectives were met by the patient during this class: Describe diabetes, types of diabetes and pathophysiology State some common risk factors for diabetes Defines the role of glucose and insulin Describe the relationship between diabetes and cardiovascular and other risks State the members of the Healthcare Team States the rationale for glucose monitoring and when to test State their individual [place] the importance of logging glucose readings and how to interpret the readings Identifies A1C target Explain the correlation between A1c and eAG values State symptoms and treatment of high blood glucose and low blood glucose Explain proper technique for glucose testing and identify proper sharps disposal  Handouts given during class include: How to Thrive:  A Guide for Your Journey with Diabetes by the ADA Meal Plan Card and carbohydrate content list Dietary intake form Low Sodium Flavoring Tips Types of Fats Dining Out Label reading Snack list The diabetes portion plate Diabetes Resources A1c to eAG Conversion Chart Blood Glucose Log Diabetes Recommended Care Schedule Support Group Diabetes Success Plan Core Class Satisfaction Survey   Follow-Up Plan: Attend core 2

## 2022-07-11 ENCOUNTER — Ambulatory Visit: Payer: Commercial Managed Care - HMO

## 2022-07-14 ENCOUNTER — Telehealth: Payer: Self-pay | Admitting: Dietician

## 2022-07-14 NOTE — Telephone Encounter (Signed)
Called patient as he missed the 2nd in a series of 3 core classes this week. He was not available.  Left a message expressing concern and requested that he call our office for any questions or to set up an appointment with the dietitian.  Provided our office number [facility]  [phone].  Oran Rein, RD, LDN, CDCES

## 2022-07-18 ENCOUNTER — Ambulatory Visit: Payer: Commercial Managed Care - HMO

## 2023-06-24 ENCOUNTER — Encounter: Payer: Self-pay | Admitting: *Deleted

## 2023-06-24 ENCOUNTER — Ambulatory Visit
Admission: EM | Admit: 2023-06-24 | Discharge: 2023-06-24 | Disposition: A | Discharge status: Home / Self Care | Payer: Managed Care, Other (non HMO)

## 2023-06-24 ENCOUNTER — Other Ambulatory Visit: Payer: Self-pay

## 2023-06-24 DIAGNOSIS — R001 Bradycardia, unspecified: Secondary | ICD-10-CM

## 2023-06-24 DIAGNOSIS — S161XXA Strain of muscle, fascia and tendon at neck level, initial encounter: Secondary | ICD-10-CM

## 2023-06-24 MED ORDER — METHOCARBAMOL 500 MG PO TABS: 500.0000 mg | ORAL_TABLET | Freq: Two times a day (BID) | ORAL | 0 refills | Status: AC | PRN
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
## 2023-06-24 NOTE — Discharge Instructions (Signed)
Suspect that you have a neck muscle strain.  I have prescribed you a muscle relaxer to take as needed.  Please be advised that it can make you drowsy so do not drive or drink alcohol with taking it.  Follow-up with your primary care doctor for evaluation of your low heart rate as we discussed.
